# Patient Record
Sex: Female | Born: 1970
Health system: Southern US, Community
[De-identification: ages and names within clinical notes are randomized; demographics above are authoritative.]

## PROBLEM LIST (undated history)

## (undated) DIAGNOSIS — E039 Hypothyroidism, unspecified: Secondary | ICD-10-CM

## (undated) DIAGNOSIS — N390 Urinary tract infection, site not specified: Secondary | ICD-10-CM

## (undated) DIAGNOSIS — R561 Post traumatic seizures: Secondary | ICD-10-CM

## (undated) DIAGNOSIS — M659 Synovitis and tenosynovitis, unspecified: Secondary | ICD-10-CM

## (undated) DIAGNOSIS — G43909 Migraine, unspecified, not intractable, without status migrainosus: Secondary | ICD-10-CM

## (undated) DIAGNOSIS — G8929 Other chronic pain: Secondary | ICD-10-CM

## (undated) DIAGNOSIS — T7840XA Allergy, unspecified, initial encounter: Secondary | ICD-10-CM

## (undated) DIAGNOSIS — E282 Polycystic ovarian syndrome: Secondary | ICD-10-CM

## (undated) DIAGNOSIS — M1990 Unspecified osteoarthritis, unspecified site: Secondary | ICD-10-CM

## (undated) DIAGNOSIS — R51 Headache: Secondary | ICD-10-CM

## (undated) DIAGNOSIS — I10 Essential (primary) hypertension: Secondary | ICD-10-CM

## (undated) DIAGNOSIS — E669 Obesity, unspecified: Secondary | ICD-10-CM

## (undated) DIAGNOSIS — J45909 Unspecified asthma, uncomplicated: Secondary | ICD-10-CM

## (undated) DIAGNOSIS — E063 Autoimmune thyroiditis: Secondary | ICD-10-CM

## (undated) DIAGNOSIS — Z90710 Acquired absence of both cervix and uterus: Secondary | ICD-10-CM

## (undated) DIAGNOSIS — M25561 Pain in right knee: Secondary | ICD-10-CM

## (undated) DIAGNOSIS — J329 Chronic sinusitis, unspecified: Secondary | ICD-10-CM

## (undated) HISTORY — DX: Obesity, unspecified: E66.9

## (undated) HISTORY — DX: Chronic sinusitis, unspecified: J32.9

## (undated) HISTORY — DX: Migraine, unspecified, not intractable, without status migrainosus: G43.909

## (undated) HISTORY — PX: KNEE SURGERY: SHX244

## (undated) HISTORY — DX: Unspecified osteoarthritis, unspecified site: M19.90

## (undated) HISTORY — DX: Autoimmune thyroiditis: E06.3

## (undated) HISTORY — DX: Acquired absence of both cervix and uterus: Z90.710

## (undated) HISTORY — DX: Allergy, unspecified, initial encounter: T78.40XA

## (undated) HISTORY — DX: Urinary tract infection, site not specified: N39.0

## (undated) HISTORY — DX: Polycystic ovarian syndrome: E28.2

## (undated) HISTORY — DX: Essential (primary) hypertension: I10

## (undated) HISTORY — DX: Post traumatic seizures: R56.1

---

## 1995-12-29 HISTORY — PX: OTHER SURGICAL HISTORY: SHX169

## 1999-03-28 ENCOUNTER — Ambulatory Visit (HOSPITAL_COMMUNITY): Admission: RE | Admit: 1999-03-28 | Discharge: 1999-03-28 | Payer: Self-pay | Admitting: Family Medicine

## 1999-03-28 ENCOUNTER — Encounter: Payer: Self-pay | Admitting: Family Medicine

## 1999-12-01 ENCOUNTER — Encounter: Admission: RE | Admit: 1999-12-01 | Discharge: 1999-12-18 | Payer: Self-pay | Admitting: Family Medicine

## 2000-07-20 ENCOUNTER — Other Ambulatory Visit: Admission: RE | Admit: 2000-07-20 | Discharge: 2000-07-20 | Payer: Self-pay | Admitting: Obstetrics and Gynecology

## 2001-09-01 ENCOUNTER — Inpatient Hospital Stay (HOSPITAL_COMMUNITY): Admission: AD | Admit: 2001-09-01 | Discharge: 2001-09-02 | Payer: Self-pay | Admitting: Family Medicine

## 2001-09-02 ENCOUNTER — Encounter: Payer: Self-pay | Admitting: Family Medicine

## 2001-09-28 ENCOUNTER — Other Ambulatory Visit: Admission: RE | Admit: 2001-09-28 | Discharge: 2001-09-28 | Payer: Self-pay | Admitting: Obstetrics and Gynecology

## 2002-03-09 ENCOUNTER — Encounter: Payer: Self-pay | Admitting: Emergency Medicine

## 2002-03-09 ENCOUNTER — Emergency Department (HOSPITAL_COMMUNITY): Admission: EM | Admit: 2002-03-09 | Discharge: 2002-03-09 | Payer: Self-pay | Admitting: Emergency Medicine

## 2003-01-09 ENCOUNTER — Emergency Department (HOSPITAL_COMMUNITY): Admission: EM | Admit: 2003-01-09 | Discharge: 2003-01-10 | Payer: Self-pay | Admitting: Emergency Medicine

## 2005-02-16 ENCOUNTER — Ambulatory Visit: Payer: Self-pay | Admitting: Family Medicine

## 2005-02-22 ENCOUNTER — Encounter: Admission: RE | Admit: 2005-02-22 | Discharge: 2005-02-22 | Payer: Self-pay | Admitting: Family Medicine

## 2005-11-02 ENCOUNTER — Ambulatory Visit: Payer: Self-pay | Admitting: Family Medicine

## 2005-11-05 ENCOUNTER — Ambulatory Visit: Payer: Self-pay | Admitting: Family Medicine

## 2006-02-04 ENCOUNTER — Other Ambulatory Visit: Admission: RE | Admit: 2006-02-04 | Discharge: 2006-02-04 | Payer: Self-pay | Admitting: Obstetrics and Gynecology

## 2006-03-18 ENCOUNTER — Ambulatory Visit: Payer: Self-pay | Admitting: Family Medicine

## 2006-05-12 ENCOUNTER — Ambulatory Visit: Payer: Self-pay | Admitting: Family Medicine

## 2006-05-17 ENCOUNTER — Ambulatory Visit: Payer: Self-pay | Admitting: Family Medicine

## 2006-05-20 ENCOUNTER — Ambulatory Visit: Payer: Self-pay | Admitting: Cardiology

## 2006-07-09 ENCOUNTER — Ambulatory Visit: Payer: Self-pay | Admitting: Internal Medicine

## 2006-09-16 ENCOUNTER — Ambulatory Visit: Payer: Self-pay | Admitting: Family Medicine

## 2007-04-13 DIAGNOSIS — J309 Allergic rhinitis, unspecified: Secondary | ICD-10-CM

## 2007-04-13 DIAGNOSIS — J45909 Unspecified asthma, uncomplicated: Secondary | ICD-10-CM | POA: Insufficient documentation

## 2007-04-13 DIAGNOSIS — Z87898 Personal history of other specified conditions: Secondary | ICD-10-CM

## 2007-04-13 DIAGNOSIS — F17200 Nicotine dependence, unspecified, uncomplicated: Secondary | ICD-10-CM

## 2007-04-13 DIAGNOSIS — G43909 Migraine, unspecified, not intractable, without status migrainosus: Secondary | ICD-10-CM | POA: Insufficient documentation

## 2007-04-17 DIAGNOSIS — E063 Autoimmune thyroiditis: Secondary | ICD-10-CM

## 2008-08-29 ENCOUNTER — Ambulatory Visit (HOSPITAL_COMMUNITY): Admission: RE | Admit: 2008-08-29 | Discharge: 2008-08-29 | Payer: Self-pay | Admitting: Obstetrics and Gynecology

## 2008-12-28 DIAGNOSIS — E282 Polycystic ovarian syndrome: Secondary | ICD-10-CM

## 2008-12-28 HISTORY — DX: Polycystic ovarian syndrome: E28.2

## 2009-07-11 ENCOUNTER — Emergency Department (HOSPITAL_COMMUNITY): Admission: EM | Admit: 2009-07-11 | Discharge: 2009-07-11 | Payer: Self-pay | Admitting: Emergency Medicine

## 2011-02-04 ENCOUNTER — Other Ambulatory Visit: Payer: Self-pay | Admitting: Family Medicine

## 2011-02-04 DIAGNOSIS — R42 Dizziness and giddiness: Secondary | ICD-10-CM

## 2011-02-04 DIAGNOSIS — G43909 Migraine, unspecified, not intractable, without status migrainosus: Secondary | ICD-10-CM

## 2011-02-10 ENCOUNTER — Ambulatory Visit
Admission: RE | Admit: 2011-02-10 | Discharge: 2011-02-10 | Disposition: A | Discharge status: Home / Self Care | Payer: PRIVATE HEALTH INSURANCE | Source: Ambulatory Visit | Attending: Family Medicine | Admitting: Family Medicine

## 2011-02-10 DIAGNOSIS — G43909 Migraine, unspecified, not intractable, without status migrainosus: Secondary | ICD-10-CM

## 2011-02-10 DIAGNOSIS — R42 Dizziness and giddiness: Secondary | ICD-10-CM

## 2011-04-06 LAB — COMPREHENSIVE METABOLIC PANEL
ALT: 37 U/L — ABNORMAL HIGH (ref 0–35)
AST: 36 U/L (ref 0–37)
CO2: 18 mEq/L — ABNORMAL LOW (ref 19–32)
Chloride: 106 mEq/L (ref 96–112)
GFR calc Af Amer: >60 mL/min (ref >60)
GFR calc non Af Amer: >60 mL/min (ref >60)
Potassium: 3.2 mEq/L — ABNORMAL LOW (ref 3.5–5.1)
Sodium: 136 mEq/L (ref 135–145)
Total Bilirubin: 0.9 mg/dL (ref 0.3–1.2)

## 2011-04-06 LAB — SALICYLATE LEVEL: Salicylate Lvl: <4 mg/dL (ref 2.8–20.0)

## 2011-04-06 LAB — DIFFERENTIAL
Basophils Absolute: 0 10*3/uL (ref 0.0–0.1)
Eosinophils Absolute: 0 10*3/uL (ref 0.0–0.7)
Eosinophils Relative: 1 % (ref 0–5)

## 2011-04-06 LAB — CBC
RBC: 4.65 MIL/uL (ref 3.87–5.11)
WBC: 6.9 10*3/uL (ref 4.0–10.5)

## 2011-04-06 LAB — ACETAMINOPHEN LEVEL: Acetaminophen (Tylenol), Serum: 49.7 ug/mL — ABNORMAL HIGH (ref 10–30)

## 2011-04-06 LAB — RAPID URINE DRUG SCREEN, HOSP PERFORMED
Barbiturates: POSITIVE — AB
Cocaine: NOT DETECTED
Opiates: NOT DETECTED

## 2011-04-06 LAB — PREGNANCY, URINE: Preg Test, Ur: NEGATIVE

## 2011-05-15 NOTE — Discharge Summary (Signed)
[place]. [facility]  Patient:    KENSIE, SUSMAN Visit Number: [id number] MRN: [id number]          Service Type: MED Location: [id number] Attending Physician:  Angelena Sole. Dictated by:   Cornell Barman, P.A. Admit Date:  09/01/2001 Discharge Date: 09/02/2001   CC:         Angelena Sole, M.D. [facility]   Discharge Summary  DISCHARGE DIAGNOSES: 1. Fever. 2. Nausea, vomiting. 3. Urinary tract infection.  BRIEF HISTORY:  Ms. Sharyn Creamer is a 40 year old white female who presented to Dr. Cleora Fleet office on the day of admission with intermittent dull lower back pain for the past week.  Prior to admission, she developed urinary frequency, dysuria, and fever.  The patient was seen in the office and found to have a urinary tract infection.  She was given Cipro and sent home.  Later in the day, she called stating that she had two episodes of emesis and persistent fever and chills.  The patient was admitted for further evaluation to rule out pyelonephritis.  PAST MEDICAL HISTORY:  Hashimoto.  Migraine headache.  Bilateral knee arthroscopy.  HOSPITAL COURSE: #1 - INFECTIOUS DISEASE:  On admission to the hospital, the patients temperature was 102.6.  White count was 10.4 with 83% neutrophils.  The patient had been started on Cipro but was admitted with IV Tequin.  A renal ultrasound was ordered to rule out obstruction of pyelonephritis.  A renal ultrasound was negative for any obstruction or pyelonephritis.  The patient defervesced and her nausea and vomiting resolved.  The patient was symptomatically improved and was felt to be stable for discharge.  #2 - HYPOKALEMIA:  Potassium was 3.1 probably secondary to her emesis.  We will give her replacement potassium prior to discharge.  #3 - HYPERGLYCEMIA:  The patients blood sugar was 162.  We did not further evaluate this and we will defer that to her primary physician.  LABORATORY DATA:  Potassium of  3.1, serum glucose 162.  Hemoglobin 12.1, white count 10.4 with 83% neutrophils.  Pending are blood cultures and renal ultrasound.  Preliminary report was negative for abscess, hydronephrosis, or obstructed ureter.  DISCHARGE MEDICATIONS: 1. Synthroid 75 mcg q.d. 2. Cytomel 25 mg q.d. 3. Dextra 20 mg as at home. 4. She is instructed to resume Cipro 500 mg b.i.d. to complete a course    of antibiotics.  FOLLOW-UP:  The patient is to follow up with Dr. Angelena Sole a couple of days after she completes the Cipro for a repeat urinalysis. Dictated by:   Cornell Barman, P.A. Attending Physician:  Angelena Sole. DD:  09/02/01 TD:  09/03/01 Job: [id number]

## 2012-02-13 ENCOUNTER — Ambulatory Visit (INDEPENDENT_AMBULATORY_CARE_PROVIDER_SITE_OTHER): Payer: BC Managed Care – PPO | Admitting: Emergency Medicine

## 2012-02-13 ENCOUNTER — Ambulatory Visit: Payer: BC Managed Care – PPO

## 2012-02-13 VITALS — BP 137/100 | HR 66 | Temp 98.3°F | Resp 20 | Ht 66.0 in | Wt 197.8 lb

## 2012-02-13 DIAGNOSIS — R05 Cough: Secondary | ICD-10-CM

## 2012-02-13 DIAGNOSIS — J019 Acute sinusitis, unspecified: Secondary | ICD-10-CM

## 2012-02-13 DIAGNOSIS — J40 Bronchitis, not specified as acute or chronic: Secondary | ICD-10-CM

## 2012-02-13 LAB — POCT CBC
Granulocyte percent: 54.2 %G (ref 37–80)
HCT, POC: 39.7 % (ref 37.7–47.9)
MCV: 91.3 fL (ref 80–97)
MID (cbc): 0.4 (ref 0–0.9)
POC Granulocyte: 3.2 (ref 2–6.9)
Platelet Count, POC: 346 10*3/uL (ref 142–424)
RBC: 4.35 M/uL (ref 4.04–5.48)

## 2012-02-13 MED ORDER — CEFDINIR 300 MG PO CAPS: 300.0000 mg | ORAL_CAPSULE | Freq: Two times a day (BID) | ORAL | Status: AC

## 2012-02-13 MED ORDER — HYDROCODONE-HOMATROPINE 5-1.5 MG/5ML PO SYRP: ORAL_SOLUTION | ORAL | Status: AC

## 2012-02-13 MED ORDER — IPRATROPIUM BROMIDE 0.06 % NA SOLN: 2.0000 | Freq: Two times a day (BID) | NASAL | Status: DC

## 2012-02-13 NOTE — Progress Notes (Signed)
Patient ID: CONSTANTINA LASETER MRN: [id number], DOB: 01/27/1971, 41 y.o. Date of Encounter: 02/13/2012, 11:40 AM  Primary Physician: No primary provider on file.  Chief Complaint:  Chief Complaint  Patient presents with  . Fever    x 2 weeks  . Nasal Congestion    face hurt  . Cough    sob; wheezing    HPI: 41 y.o. year old female presents with 2 week history of worsening nasal congestion, cough, sinus pressure, and subjective tactile fever. Seen at outside urgent care about 10 days prior. Given Prednisone 5 day taper (finished 02/08/12), and Z-pack (finished 02/08/12). Initially symptoms improved with this treatment, they however returned and are now worse. Has developed some chest congestion. Cough is productive of green sputum and not associated with time of day. Sinus headache. T max 98.8. Hearing is muffled. Nasal congestion thick and green. Pushing fluids. No GI symptoms/no reflux. Friend's child with URI/fever the day before symptoms began.   No leg trauma, sedentary periods, h/o cancer, or tobacco use.  Past Medical History  Diagnosis Date  . PCOS (polycystic ovarian syndrome) 2010    followed by Dr. Perlie Gold  . Hashimoto's thyroiditis      Home Meds: Prior to Admission medications   Medication Sig Start Date End Date Taking? Authorizing Provider  levothyroxine (SYNTHROID, LEVOTHROID) 50 MCG tablet Take 50 mcg by mouth daily.   Yes Historical Provider, MD  liothyronine (CYTOMEL) 25 MCG tablet Take 25 mcg by mouth daily.   Yes Historical Provider, MD  metFORMIN (GLUCOPHAGE) 500 MG tablet Take 500 mg by mouth 3 (three) times daily.   Yes Historical Provider, MD  propranolol (INDERAL LA) 120 MG 24 hr capsule Take 120 mg by mouth daily.   Yes Historical Provider, MD  temazepam (RESTORIL) 30 MG capsule Take 30 mg by mouth at bedtime as needed.   Yes Historical Provider, MD  traMADol (ULTRAM) 50 MG tablet Take 50 mg by mouth every 6 (six) hours as needed.   Yes Historical  Provider, MD    Allergies: No Known Allergies  History   Social History  . Marital Status: Single    Spouse Name: N/A    Number of Children: N/A  . Years of Education: N/A   Occupational History  . Not on file.   Social History Main Topics  . Smoking status: Never Smoker   . Smokeless tobacco: Not on file  . Alcohol Use: Not on file  . Drug Use: Not on file  . Sexually Active: Not on file   Other Topics Concern  . Not on file   Social History Narrative  . No narrative on file     Review of Systems: Constitutional: negative for chills, fever, night sweats or weight changes Cardiovascular: negative for chest pain or palpitations Respiratory: negative for hemoptysis, wheezing, or shortness of breath Abdominal: negative for abdominal pain, nausea, vomiting or diarrhea Dermatological: negative for rash Neurologic: negative for headache   Physical Exam: Blood pressure 137/100, pulse 66, temperature 98.3 F (36.8 C), temperature source Oral, resp. rate 20, height 5\' 6"  (1.676 m), weight 197 lb 12.8 oz (89.721 kg), last menstrual period 02/08/2012., Body mass index is 31.93 kg/(m^2). General: Well developed, well nourished, in no acute distress. Head: Normocephalic, atraumatic, eyes without discharge, sclera non-icteric, nares are congested. Bilateral auditory canals clear, TM's are without perforation, pearly grey with reflective cone of light bilaterally. Bilateral maxillary sinuses TTP. Oral cavity moist, dentition normal. Posterior pharynx with post nasal  drip and mild erythema. No peritonsillar abscess or tonsillar exudate. Neck: Supple. No thyromegaly. Full ROM. No lymphadenopathy. Lungs: Coarse breath sounds without wheezes, rales, or rhonchi. Breathing is unlabored. Heart: RRR with S1 S2. No murmurs, rubs, or gallops appreciated. Msk:  Strength and tone normal for age. Extremities: No clubbing or cyanosis. No edema. Neuro: Alert and oriented X 3. Moves all extremities  spontaneously. CNII-XII grossly in tact. Psych:  Responds to questions appropriately with a normal affect.   Labs: Results for orders placed in visit on 02/13/12  POCT CBC      Component Value Range   WBC 5.9  4.6 - 10.2 (K/uL)   Lymph, poc 2.3  0.6 - 3.4    POC LYMPH PERCENT 38.4  10 - 50 (%L)   MID (cbc) 0.4  0 - 0.9    POC MID % 7.4  0 - 12 (%M)   POC Granulocyte 3.2  2 - 6.9    Granulocyte percent 54.2  37 - 80 (%G)   RBC 4.35  4.04 - 5.48 (M/uL)   Hemoglobin 12.6  12.2 - 16.2 (g/dL)   HCT, POC 54.0  98.1 - 47.9 (%)   MCV 91.3  80 - 97 (fL)   MCH, POC 29.0  27 - 31.2 (pg)   MCHC 31.7 (*) 31.8 - 35.4 (g/dL)   RDW, POC 19.1     Platelet Count, POC 346  142 - 424 (K/uL)   MPV 8.6  0 - 99.8 (fL)   [facility] reading (PRIMARY) by  Dr. Cleta Alberts. NAD    ASSESSMENT AND PLAN:  41 y.o. year old female with sinobronchitis. -Omnicef 300 mg 1 po bid #20 no RF  -Hycodan #4oz 1 tsp po q 4-6 hours prn cough no RF SED -Atrovent NS 0.06% 2 sprays each nare bid prn #1 no RF -Mucinex -Tylenol/Motrin prn -Rest/fluids -RTC precautions -RTC 3-5 days if no improvement  Signed, Eula Listen, PA-C 02/13/2012 11:40 AM

## 2012-02-13 NOTE — Patient Instructions (Signed)

## 2012-09-05 ENCOUNTER — Ambulatory Visit (INDEPENDENT_AMBULATORY_CARE_PROVIDER_SITE_OTHER): Payer: BC Managed Care – PPO | Admitting: Family Medicine

## 2012-09-05 VITALS — BP 126/88 | HR 68 | Temp 98.1°F | Resp 14 | Ht 66.5 in | Wt 199.0 lb

## 2012-09-05 DIAGNOSIS — R51 Headache: Secondary | ICD-10-CM

## 2012-09-05 DIAGNOSIS — R5383 Other fatigue: Secondary | ICD-10-CM

## 2012-09-05 DIAGNOSIS — R5381 Other malaise: Secondary | ICD-10-CM

## 2012-09-05 DIAGNOSIS — T148XXA Other injury of unspecified body region, initial encounter: Secondary | ICD-10-CM

## 2012-09-05 DIAGNOSIS — K219 Gastro-esophageal reflux disease without esophagitis: Secondary | ICD-10-CM

## 2012-09-05 DIAGNOSIS — R079 Chest pain, unspecified: Secondary | ICD-10-CM

## 2012-09-05 LAB — COMPREHENSIVE METABOLIC PANEL
Alkaline Phosphatase: 49 U/L (ref 39–117)
BUN: 11 mg/dL (ref 6–23)
CO2: 23 mEq/L (ref 19–32)
Glucose, Bld: 85 mg/dL (ref 70–99)
Total Bilirubin: 0.3 mg/dL (ref 0.3–1.2)

## 2012-09-05 LAB — POCT CBC
HCT, POC: 44.8 % (ref 37.7–47.9)
Lymph, poc: 2.2 (ref 0.6–3.4)
MCH, POC: 29.4 pg (ref 27–31.2)
MCHC: 31.3 g/dL — AB (ref 31.8–35.4)
MCV: 94.2 fL (ref 80–97)
POC Granulocyte: 4.1 (ref 2–6.9)
POC LYMPH PERCENT: 32.6 %L (ref 10–50)
RDW, POC: 12.7 %
WBC: 6.7 10*3/uL (ref 4.6–10.2)

## 2012-09-05 LAB — POCT UA - MICROSCOPIC ONLY
Casts, Ur, LPF, POC: NEGATIVE
Crystals, Ur, HPF, POC: NEGATIVE
Yeast, UA: NEGATIVE

## 2012-09-05 LAB — POCT GLYCOSYLATED HEMOGLOBIN (HGB A1C): Hemoglobin A1C: 5

## 2012-09-05 LAB — POCT URINALYSIS DIPSTICK
Blood, UA: NEGATIVE
Nitrite, UA: NEGATIVE
Protein, UA: NEGATIVE
Spec Grav, UA: 1.025
Urobilinogen, UA: 0.2
pH, UA: 6.5

## 2012-09-05 LAB — TSH: TSH: 2.031 u[IU]/mL (ref 0.350–4.500)

## 2012-09-05 MED ORDER — GI COCKTAIL ~~LOC~~
30.0000 mL | Freq: Once | ORAL | Status: AC
  Administered 2012-09-05: 30 mL via ORAL

## 2012-09-05 NOTE — Progress Notes (Addendum)
[facility] [address] [place] [address] [phone]  Date:  09/05/2012   Name:  Daisy Ramirez   DOB:  12-Feb-1971   MRN:  [id number]  PCP:  No primary provider on file.    Chief Complaint: Fatigue, Headache and Gastrophageal Reflux   History of Present Illness:  Daisy Ramirez is a 41 y.o. very pleasant female patient who presents with the following:  Here today to evaluate a constellation of symptoms, all of which have been present for about 4 months.  She has noted fatigue which is getting worse.  She thought at first that she was sick, but her fatigue has not relented.   She also notes HA, earache, ringing in her ears, pain in her armpits, pain in her lower back, and a bruise on her left leg that has been present for 2 months without healing.   She has been following her temperature, but has not noted a fever greater than 99.6 or so.  However, she feels that her normal temperature is 96 degrees so she wonders if 99.6 does constitute a fever for her.    She is not losing weight, fluctuates a couple of lbs up and down.  She has tried to lose weight but has not been successful. Feels that her neck is swollen.  Diagnosed with Hasimoto's at 41 years old.  She has not had a TSH check in some time.    She also has a history of PCOS. She uses metformin for this per Dr. Vincente Poli.  She has some light menstrual bleeding right now. She took a home HCG last week and it was negative.  Is also on OCP She has noted some diarrhea.  No vomiting.    She has noted GERD symptoms. She has noted chest pains- dull aches and sharp pains.  This has gone on for several months and is not related to exertion.  She has a little bit of an ache now- thinks this is all due to GERD.  Feels like GERD that she has noted in the past.      There is a familiy history of HTN and high choelsterol.   She usually does best with TSH between 0.5 and 1  Patient Active Problem List  Diagnosis   . HASHIMOTO'S THYROIDITIS  . SMOKER  . ALLERGIC RHINITIS  . ASTHMA  . MIGRAINES, HX OF    Past Medical History  Diagnosis Date  . PCOS (polycystic ovarian syndrome) 2010    followed by Dr. Perlie Gold  . Hashimoto's thyroiditis     Past Surgical History  Procedure Date  . Bilateral knee scope 1997    History  Substance Use Topics  . Smoking status: Never Smoker   . Smokeless tobacco: Not on file  . Alcohol Use: Not on file    No family history on file.  No Known Allergies  Medication list has been reviewed and updated.  Current Outpatient Prescriptions on File Prior to Visit  Medication Sig Dispense Refill  . fluticasone (FLONASE) 50 MCG/ACT nasal spray Place 2 sprays into the nose daily.      Marland Kitchen levothyroxine (SYNTHROID, LEVOTHROID) 50 MCG tablet Take 50 mcg by mouth daily.      Marland Kitchen liothyronine (CYTOMEL) 25 MCG tablet Take 25 mcg by mouth daily.      . metFORMIN (GLUCOPHAGE) 500 MG tablet Take 500 mg by mouth 3 (three) times daily.      . norethindrone-ethinyl estradiol (MICROGESTIN,JUNEL,LOESTRIN) 1-20  MG-MCG tablet Take 1 tablet by mouth daily.      . propranolol (INDERAL LA) 120 MG 24 hr capsule Take 120 mg by mouth daily.      . temazepam (RESTORIL) 30 MG capsule Take 30 mg by mouth at bedtime as needed.      . traMADol (ULTRAM) 50 MG tablet Take 50 mg by mouth every 6 (six) hours as needed.      Marland Kitchen ipratropium (ATROVENT) 0.06 % nasal spray Place 2 sprays into the nose 2 (two) times daily.  15 mL  0    Review of Systems:  As per HPI- otherwise negative.   Physical Examination: Filed Vitals:   09/05/12 1017  BP: 126/88  Pulse: 68  Temp: 98.1 F (36.7 C)  Resp: 14   Filed Vitals:   09/05/12 1017  Height: 5' 6.5" (1.689 m)  Weight: 199 lb (90.266 kg)   Body mass index is 31.64 kg/(m^2). Ideal Body Weight: Weight in (lb) to have BMI = 25: 156.9   GEN: WDWN, NAD, Non-toxic, A & O x 3, obese HEENT: Atraumatic, Normocephalic. Neck supple. No masses, No  LAD.  TM and oropharynx wnl, PEERL, EOMI.  Ears and Nose: No external deformity. CV: RRR, No M/G/R. No JVD. No thrill. No extra heart sounds. PULM: CTA B, no wheezes, crackles, rhonchi. No retractions. No resp. distress. No accessory muscle use. ABD: S, NT, ND, +BS. No rebound. No HSM. EXTR: No c/c/e.  Not able to appreciate "bruise" on left shin, but she does have a tender area where she took a blow to the shin a couple of months ago.   No axillary lymph nodes noted. No swelling or tenderness.   NEURO Normal gait.  PSYCH: Normally interactive. Conversant. Not depressed or anxious appearing.  Calm demeanor.    EKG: NSR, no ST elevation or depression.  GI cocktail: did not make a difference in her symptoms  Results for orders placed in visit on 09/05/12  POCT CBC      Component Value Range   WBC 6.7  4.6 - 10.2 K/uL   Lymph, poc 2.2  0.6 - 3.4   POC LYMPH PERCENT 32.6  10 - 50 %L   MID (cbc) 0.4  0 - 0.9   POC MID % 5.5  0 - 12 %M   POC Granulocyte 4.1  2 - 6.9   Granulocyte percent 61.9  37 - 80 %G   RBC 4.76  4.04 - 5.48 M/uL   Hemoglobin 14.0  12.2 - 16.2 g/dL   HCT, POC 30.8  65.7 - 47.9 %   MCV 94.2  80 - 97 fL   MCH, POC 29.4  27 - 31.2 pg   MCHC 31.3 (*) 31.8 - 35.4 g/dL   RDW, POC 84.6     Platelet Count, POC 408  142 - 424 K/uL   MPV 9.0  0 - 99.8 fL  POCT GLYCOSYLATED HEMOGLOBIN (HGB A1C)      Component Value Range   Hemoglobin A1C 5.0    POCT UA - MICROSCOPIC ONLY      Component Value Range   WBC, Ur, HPF, POC 0-1     RBC, urine, microscopic 0-1     Bacteria, U Microscopic trace     Mucus, UA trace     Epithelial cells, urine per micros 1-5     Crystals, Ur, HPF, POC neg     Casts, Ur, LPF, POC neg     Yeast, UA neg  POCT URINALYSIS DIPSTICK      Component Value Range   Color, UA yellow     Clarity, UA clear     Glucose, UA neg     Bilirubin, UA neg     Ketones, UA trace     Spec Grav, UA 1.025     Blood, UA neg     pH, UA 6.5     Protein, UA neg       Urobilinogen, UA 0.2     Nitrite, UA neg     Leukocytes, UA Negative    POCT URINE PREGNANCY      Component Value Range   Preg Test, Ur Negative      Assessment and Plan: 1. Fatigue  POCT CBC, POCT glycosylated hemoglobin (Hb A1C), POCT UA - Microscopic Only, POCT urinalysis dipstick, POCT urine pregnancy, Comprehensive metabolic panel, TSH, EKG 12-Lead  2. Chest pain    3. GERD (gastroesophageal reflux disease)  EKG 12-Lead, gi cocktail (Maalox,Lidocaine,Donnatal)  4. Headache    5. Malaise    6. Bruising  POCT CBC   Multiple symptoms today.  Most likely problem is that her thyroid replacement may need adjustment.  Await her TSH and also CMP.  Other POC labs done today as above and are normal.  If her TSH is normal consider having her see cards for a stress test.  However, her duration of symptoms/ description of symptoms and normal EKG are certainly reassuring today.  Will plan further follow- up pending labs- Sooner if worse or if there is any change.      Omaree Fuqua, MD  Called on 9/12- received labs, look ok but her TSH is just above 2.   Daisy Ramirez has done best when her TSH is more around 0.5 to 1 in the past.  We can increase her levothyroxine a little bit to 75 mcg, and she will come in for a recheck TSH in 6- 8 weeks.  She will let me know sooner if this is not helping.

## 2012-09-08 MED ORDER — LEVOTHYROXINE SODIUM 75 MCG PO TABS: 75.0000 ug | ORAL_TABLET | Freq: Every day | ORAL | Status: DC

## 2012-09-08 NOTE — Addendum Note (Signed)
Addended by: Abbe Amsterdam C on: 09/08/2012 05:17 PM   Modules accepted: Orders

## 2012-11-04 ENCOUNTER — Encounter (HOSPITAL_COMMUNITY): Payer: Self-pay | Admitting: Pharmacist

## 2012-11-11 ENCOUNTER — Encounter (HOSPITAL_COMMUNITY): Payer: Self-pay

## 2012-11-11 ENCOUNTER — Encounter (HOSPITAL_COMMUNITY)
Admission: RE | Admit: 2012-11-11 | Discharge: 2012-11-11 | Disposition: A | Discharge status: Home / Self Care | Payer: BC Managed Care – PPO | Source: Ambulatory Visit | Attending: Obstetrics and Gynecology | Admitting: Obstetrics and Gynecology

## 2012-11-11 HISTORY — DX: Hypothyroidism, unspecified: E03.9

## 2012-11-11 HISTORY — DX: Headache: R51

## 2012-11-11 HISTORY — DX: Unspecified asthma, uncomplicated: J45.909

## 2012-11-11 LAB — SURGICAL PCR SCREEN
MRSA, PCR: NEGATIVE
Staphylococcus aureus: NEGATIVE

## 2012-11-11 LAB — CBC
Hemoglobin: 12.9 g/dL (ref 12.0–15.0)
MCH: 30.2 pg (ref 26.0–34.0)
MCHC: 34.1 g/dL (ref 30.0–36.0)
Platelets: 318 10*3/uL (ref 150–400)
RBC: 4.27 MIL/uL (ref 3.87–5.11)

## 2012-11-11 LAB — BASIC METABOLIC PANEL
CO2: 21 mEq/L (ref 19–32)
Calcium: 9.5 mg/dL (ref 8.4–10.5)
GFR calc non Af Amer: >90 mL/min (ref >90)
Glucose, Bld: 90 mg/dL (ref 70–99)
Potassium: 4.3 mEq/L (ref 3.5–5.1)
Sodium: 133 mEq/L — ABNORMAL LOW (ref 135–145)

## 2012-11-11 NOTE — Patient Instructions (Addendum)
Your procedure is scheduled on:11/16/12  Enter through the Main Entrance at : 0700 am Pick up desk phone and dial [phone] and inform us of your arrival.  Please call [phone] if you have any problems the morning of surgery.  Remember: Do not eat or drink after midnight:Tuesday   Take these meds the morning of surgery with a sip of water: thyroid meds  DO NOT wear jewelry, eye make-up, lipstick,body lotion, or dark fingernail polish. Do not shave for 48 hours prior to surgery.  If you are to be admitted after surgery, leave suitcase in car until your room has been assigned.

## 2012-11-15 MED ORDER — CEFAZOLIN SODIUM-DEXTROSE 2-3 GM-% IV SOLR
2.0000 g | INTRAVENOUS | Status: AC
  Administered 2012-11-16: 2 g via INTRAVENOUS

## 2012-11-15 NOTE — H&P (Signed)
41 year old G 1 P 0 presents for TAH and BSO. She has a long history of PCOS, Pelvic pain, Ovarian cysts and DUB. She underwent a D and C in the pas. Sometimes she bleeds for 8 weeks at a time. She has tried multiple birth control pills without any relief.  She also has a lot of lower abdominal pelvic pain that seems to bother her all the time.   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    Medical history : Headaches Hypothyroidism PCOS  Surgical History: D AND C  Meds: Synthroid Cytomel Metformin Tramadol Birth Control pills  NKDA  Family history: Unremarkable  Social History: No tobacco Single 2 alcohol drinks per day  ROS: Positive for fatigue and headaches  Afebrile Vital signs stable General alert and oriented Lung CTAB Car RRR Abdomen is soft and non tender Pelvic external genitalia within normal limits. Vagina appears normal Cervix is deep in the vagina Uterus is mobile and very tender with movement. Right adnexal tenderness is noted Left adnexal tenderness is noted but less than on the right  IMPRESSION: Abnormal bleeding and pelvic pain  PLAN: TAH AND BSO Risks of infection, injury to organs, bleeding, anesthesia reviewed with the patient

## 2012-11-16 ENCOUNTER — Inpatient Hospital Stay (HOSPITAL_COMMUNITY): Payer: BC Managed Care – PPO | Admitting: Registered Nurse

## 2012-11-16 ENCOUNTER — Inpatient Hospital Stay (HOSPITAL_COMMUNITY)
Admission: RE | Admit: 2012-11-16 | Discharge: 2012-11-18 | DRG: 359 | Disposition: A | Discharge status: Home / Self Care | Payer: BC Managed Care – PPO | Source: Ambulatory Visit | Attending: Obstetrics and Gynecology | Admitting: Obstetrics and Gynecology

## 2012-11-16 ENCOUNTER — Encounter (HOSPITAL_COMMUNITY): Payer: Self-pay | Admitting: Registered Nurse

## 2012-11-16 ENCOUNTER — Encounter (HOSPITAL_COMMUNITY)
Admission: RE | Disposition: A | Discharge status: Home / Self Care | Payer: Self-pay | Source: Ambulatory Visit | Attending: Obstetrics and Gynecology

## 2012-11-16 ENCOUNTER — Encounter (HOSPITAL_COMMUNITY): Payer: Self-pay

## 2012-11-16 DIAGNOSIS — N938 Other specified abnormal uterine and vaginal bleeding: Secondary | ICD-10-CM | POA: Diagnosis present

## 2012-11-16 DIAGNOSIS — E282 Polycystic ovarian syndrome: Secondary | ICD-10-CM | POA: Diagnosis present

## 2012-11-16 DIAGNOSIS — D251 Intramural leiomyoma of uterus: Secondary | ICD-10-CM | POA: Diagnosis present

## 2012-11-16 DIAGNOSIS — D250 Submucous leiomyoma of uterus: Secondary | ICD-10-CM | POA: Diagnosis present

## 2012-11-16 DIAGNOSIS — N949 Unspecified condition associated with female genital organs and menstrual cycle: Principal | ICD-10-CM | POA: Diagnosis present

## 2012-11-16 DIAGNOSIS — N838 Other noninflammatory disorders of ovary, fallopian tube and broad ligament: Secondary | ICD-10-CM | POA: Diagnosis present

## 2012-11-16 DIAGNOSIS — R5383 Other fatigue: Secondary | ICD-10-CM

## 2012-11-16 HISTORY — PX: ABDOMINAL HYSTERECTOMY: SHX81

## 2012-11-16 HISTORY — PX: SALPINGOOPHORECTOMY: SHX82

## 2012-11-16 LAB — PREGNANCY, URINE: Preg Test, Ur: NEGATIVE

## 2012-11-16 SURGERY — HYSTERECTOMY, ABDOMINAL
Anesthesia: General | Site: Abdomen | Wound class: Clean Contaminated

## 2012-11-16 MED ORDER — MEPERIDINE HCL 25 MG/ML IJ SOLN
6.2500 mg | INTRAMUSCULAR | Status: DC | PRN
  Administered 2012-11-16: 12.5 mg via INTRAVENOUS

## 2012-11-16 MED ORDER — FLUTICASONE PROPIONATE 50 MCG/ACT NA SUSP
2.0000 | Freq: Every day | NASAL | Status: DC | PRN
  Administered 2012-11-17: 2 via NASAL
  Filled 2012-11-16: qty 16

## 2012-11-16 MED ORDER — DEXAMETHASONE SODIUM PHOSPHATE 10 MG/ML IJ SOLN
INTRAMUSCULAR | Status: DC | PRN
  Administered 2012-11-16: 10 mg via INTRAVENOUS

## 2012-11-16 MED ORDER — ONDANSETRON HCL 4 MG/2ML IJ SOLN: 4.0000 mg | Freq: Four times a day (QID) | INTRAMUSCULAR | Status: DC | PRN

## 2012-11-16 MED ORDER — ONDANSETRON HCL 4 MG/2ML IJ SOLN
INTRAMUSCULAR | Status: DC | PRN
  Administered 2012-11-16: 4 mg via INTRAVENOUS

## 2012-11-16 MED ORDER — CEFAZOLIN SODIUM-DEXTROSE 2-3 GM-% IV SOLR
INTRAVENOUS | Status: AC
  Filled 2012-11-16: qty 50

## 2012-11-16 MED ORDER — LIOTHYRONINE SODIUM 25 MCG PO TABS
25.0000 ug | ORAL_TABLET | Freq: Every day | ORAL | Status: DC
  Administered 2012-11-17 – 2012-11-18 (×2): 25 ug via ORAL
  Filled 2012-11-16 (×2): qty 1

## 2012-11-16 MED ORDER — PROPOFOL 10 MG/ML IV EMUL
INTRAVENOUS | Status: AC
  Filled 2012-11-16: qty 20

## 2012-11-16 MED ORDER — NALOXONE HCL 0.4 MG/ML IJ SOLN: 0.4000 mg | INTRAMUSCULAR | Status: DC | PRN

## 2012-11-16 MED ORDER — FENTANYL CITRATE 0.05 MG/ML IJ SOLN
INTRAMUSCULAR | Status: AC
  Filled 2012-11-16: qty 5

## 2012-11-16 MED ORDER — TRAMADOL HCL 50 MG PO TABS: 50.0000 mg | ORAL_TABLET | Freq: Four times a day (QID) | ORAL | Status: DC | PRN

## 2012-11-16 MED ORDER — HYDROMORPHONE HCL PF 1 MG/ML IJ SOLN
INTRAMUSCULAR | Status: AC
  Administered 2012-11-16: 0.5 mg via INTRAVENOUS
  Filled 2012-11-16: qty 1

## 2012-11-16 MED ORDER — DIPHENHYDRAMINE HCL 12.5 MG/5ML PO ELIX: 12.5000 mg | ORAL_SOLUTION | Freq: Four times a day (QID) | ORAL | Status: DC | PRN

## 2012-11-16 MED ORDER — DIPHENHYDRAMINE HCL 50 MG/ML IJ SOLN: 12.5000 mg | Freq: Four times a day (QID) | INTRAMUSCULAR | Status: DC | PRN

## 2012-11-16 MED ORDER — SODIUM CHLORIDE 0.9 % IJ SOLN: 9.0000 mL | INTRAMUSCULAR | Status: DC | PRN

## 2012-11-16 MED ORDER — LACTATED RINGERS IV SOLN
INTRAVENOUS | Status: DC
  Administered 2012-11-16: 07:00:00 via INTRAVENOUS

## 2012-11-16 MED ORDER — GLYCOPYRROLATE 0.2 MG/ML IJ SOLN
INTRAMUSCULAR | Status: DC | PRN
  Administered 2012-11-16: 0.6 mg via INTRAVENOUS

## 2012-11-16 MED ORDER — PNEUMOCOCCAL VAC POLYVALENT 25 MCG/0.5ML IJ INJ
0.5000 mL | INJECTION | INTRAMUSCULAR | Status: AC
  Administered 2012-11-17: 0.5 mL via INTRAMUSCULAR
  Filled 2012-11-16: qty 0.5

## 2012-11-16 MED ORDER — DEXTROSE IN LACTATED RINGERS 5 % IV SOLN
INTRAVENOUS | Status: DC
  Administered 2012-11-16 – 2012-11-17 (×2): via INTRAVENOUS

## 2012-11-16 MED ORDER — BUPIVACAINE HCL (PF) 0.25 % IJ SOLN
INTRAMUSCULAR | Status: AC
  Filled 2012-11-16: qty 90

## 2012-11-16 MED ORDER — KETOROLAC TROMETHAMINE 30 MG/ML IJ SOLN
INTRAMUSCULAR | Status: AC
  Administered 2012-11-16: 30 mg via INTRAMUSCULAR
  Filled 2012-11-16: qty 1

## 2012-11-16 MED ORDER — NEOSTIGMINE METHYLSULFATE 1 MG/ML IJ SOLN
INTRAMUSCULAR | Status: DC | PRN
  Administered 2012-11-16: 4 mg via INTRAVENOUS

## 2012-11-16 MED ORDER — MIDAZOLAM HCL 2 MG/2ML IJ SOLN
INTRAMUSCULAR | Status: AC
  Filled 2012-11-16: qty 2

## 2012-11-16 MED ORDER — DEXAMETHASONE SODIUM PHOSPHATE 10 MG/ML IJ SOLN
INTRAMUSCULAR | Status: AC
  Filled 2012-11-16: qty 1

## 2012-11-16 MED ORDER — HYDROMORPHONE HCL PF 1 MG/ML IJ SOLN
0.2500 mg | INTRAMUSCULAR | Status: DC | PRN
  Administered 2012-11-16 (×5): 0.5 mg via INTRAVENOUS

## 2012-11-16 MED ORDER — TEMAZEPAM 15 MG PO CAPS: 15.0000 mg | ORAL_CAPSULE | Freq: Every evening | ORAL | Status: DC | PRN

## 2012-11-16 MED ORDER — ROCURONIUM BROMIDE 50 MG/5ML IV SOLN
INTRAVENOUS | Status: AC
  Filled 2012-11-16: qty 1

## 2012-11-16 MED ORDER — HYDROMORPHONE HCL PF 1 MG/ML IJ SOLN
INTRAMUSCULAR | Status: AC
  Filled 2012-11-16: qty 1

## 2012-11-16 MED ORDER — KETOROLAC TROMETHAMINE 30 MG/ML IJ SOLN
INTRAMUSCULAR | Status: AC
  Filled 2012-11-16: qty 1

## 2012-11-16 MED ORDER — SCOPOLAMINE 1 MG/3DAYS TD PT72
1.0000 | MEDICATED_PATCH | TRANSDERMAL | Status: DC
  Administered 2012-11-16: 1.5 mg via TRANSDERMAL

## 2012-11-16 MED ORDER — LIDOCAINE HCL (CARDIAC) 20 MG/ML IV SOLN
INTRAVENOUS | Status: DC | PRN
  Administered 2012-11-16: 80 mg via INTRAVENOUS

## 2012-11-16 MED ORDER — LEVOTHYROXINE SODIUM 75 MCG PO TABS
75.0000 ug | ORAL_TABLET | Freq: Every day | ORAL | Status: DC
  Administered 2012-11-17 – 2012-11-18 (×2): 75 ug via ORAL
  Filled 2012-11-16 (×2): qty 1

## 2012-11-16 MED ORDER — IBUPROFEN 600 MG PO TABS
600.0000 mg | ORAL_TABLET | Freq: Four times a day (QID) | ORAL | Status: DC | PRN
  Administered 2012-11-17 – 2012-11-18 (×4): 600 mg via ORAL
  Filled 2012-11-16 (×4): qty 1

## 2012-11-16 MED ORDER — LIDOCAINE HCL (CARDIAC) 20 MG/ML IV SOLN
INTRAVENOUS | Status: AC
  Filled 2012-11-16: qty 5

## 2012-11-16 MED ORDER — KETOROLAC TROMETHAMINE 30 MG/ML IJ SOLN
INTRAMUSCULAR | Status: DC | PRN
  Administered 2012-11-16: 30 mg via INTRAVENOUS

## 2012-11-16 MED ORDER — PROPRANOLOL HCL ER 120 MG PO CP24
120.0000 mg | ORAL_CAPSULE | Freq: Every day | ORAL | Status: DC
Start: 2012-11-16 — End: 2012-11-18
  Administered 2012-11-16 – 2012-11-17 (×2): 120 mg via ORAL
  Filled 2012-11-16 (×3): qty 1

## 2012-11-16 MED ORDER — KETOROLAC TROMETHAMINE 30 MG/ML IJ SOLN
30.0000 mg | Freq: Once | INTRAMUSCULAR | Status: AC
  Administered 2012-11-16: 30 mg via INTRAMUSCULAR

## 2012-11-16 MED ORDER — FENTANYL CITRATE 0.05 MG/ML IJ SOLN
INTRAMUSCULAR | Status: DC | PRN
  Administered 2012-11-16 (×3): 50 ug via INTRAVENOUS
  Administered 2012-11-16: 100 ug via INTRAVENOUS
  Administered 2012-11-16 (×2): 50 ug via INTRAVENOUS
  Administered 2012-11-16: 100 ug via INTRAVENOUS
  Administered 2012-11-16: 50 ug via INTRAVENOUS

## 2012-11-16 MED ORDER — ONDANSETRON HCL 4 MG/2ML IJ SOLN
INTRAMUSCULAR | Status: AC
  Filled 2012-11-16: qty 2

## 2012-11-16 MED ORDER — MEPERIDINE HCL 25 MG/ML IJ SOLN
INTRAMUSCULAR | Status: AC
  Filled 2012-11-16: qty 1

## 2012-11-16 MED ORDER — HYDROMORPHONE HCL PF 1 MG/ML IJ SOLN
0.5000 mg | INTRAMUSCULAR | Status: DC | PRN
  Administered 2012-11-16: 0.5 mg via INTRAVENOUS

## 2012-11-16 MED ORDER — ROCURONIUM BROMIDE 100 MG/10ML IV SOLN
INTRAVENOUS | Status: DC | PRN
  Administered 2012-11-16: 50 mg via INTRAVENOUS
  Administered 2012-11-16: 10 mg via INTRAVENOUS

## 2012-11-16 MED ORDER — PROPOFOL 10 MG/ML IV BOLUS
INTRAVENOUS | Status: DC | PRN
  Administered 2012-11-16: 200 mg via INTRAVENOUS

## 2012-11-16 MED ORDER — MENTHOL 3 MG MT LOZG: 1.0000 | LOZENGE | OROMUCOSAL | Status: DC | PRN

## 2012-11-16 MED ORDER — BUPIVACAINE HCL (PF) 0.25 % IJ SOLN
INTRAMUSCULAR | Status: DC | PRN
  Administered 2012-11-16: 10 mL

## 2012-11-16 MED ORDER — MIDAZOLAM HCL 5 MG/5ML IJ SOLN
INTRAMUSCULAR | Status: DC | PRN
  Administered 2012-11-16: 2 mg via INTRAVENOUS

## 2012-11-16 MED ORDER — SCOPOLAMINE 1 MG/3DAYS TD PT72
MEDICATED_PATCH | TRANSDERMAL | Status: AC
  Administered 2012-11-16: 1.5 mg via TRANSDERMAL
  Filled 2012-11-16: qty 1

## 2012-11-16 MED ORDER — HYDROMORPHONE 0.3 MG/ML IV SOLN
INTRAVENOUS | Status: DC
  Administered 2012-11-16: 2.3 mg via INTRAVENOUS
  Administered 2012-11-16: 0.6 mg via INTRAVENOUS
  Administered 2012-11-16: 11:00:00 via INTRAVENOUS
  Administered 2012-11-16: 0.9 mg via INTRAVENOUS
  Administered 2012-11-17: 1.8 mg via INTRAVENOUS
  Administered 2012-11-17: 1.18 mg via INTRAVENOUS
  Filled 2012-11-16 (×2): qty 25

## 2012-11-16 MED ORDER — HYDROMORPHONE HCL PF 1 MG/ML IJ SOLN
INTRAMUSCULAR | Status: DC | PRN
  Administered 2012-11-16: 1 mg via INTRAVENOUS

## 2012-11-16 MED ORDER — LACTATED RINGERS IV SOLN
INTRAVENOUS | Status: DC
  Administered 2012-11-16 (×3): via INTRAVENOUS

## 2012-11-16 MED ORDER — GLYCOPYRROLATE 0.2 MG/ML IJ SOLN
INTRAMUSCULAR | Status: AC
  Filled 2012-11-16: qty 3

## 2012-11-16 MED ORDER — KETOROLAC TROMETHAMINE 30 MG/ML IJ SOLN: 30.0000 mg | Freq: Once | INTRAMUSCULAR | Status: DC

## 2012-11-16 MED ORDER — METOCLOPRAMIDE HCL 5 MG/ML IJ SOLN: 10.0000 mg | Freq: Once | INTRAMUSCULAR | Status: DC | PRN

## 2012-11-16 MED ORDER — NEOSTIGMINE METHYLSULFATE 1 MG/ML IJ SOLN
INTRAMUSCULAR | Status: AC
  Filled 2012-11-16: qty 10

## 2012-11-16 SURGICAL SUPPLY — 45 items
ADH SKN CLS APL DERMABOND .7 (GAUZE/BANDAGES/DRESSINGS)
BRR ADH 6X5 SEPRAFILM 1 SHT (MISCELLANEOUS)
CANISTER SUCTION 2500CC (MISCELLANEOUS) ×3 IMPLANT
CHLORAPREP W/TINT 26ML (MISCELLANEOUS) ×3 IMPLANT
CLOTH BEACON ORANGE TIMEOUT ST (SAFETY) ×3 IMPLANT
DECANTER SPIKE VIAL GLASS SM (MISCELLANEOUS) ×2 IMPLANT
DERMABOND ADVANCED (GAUZE/BANDAGES/DRESSINGS)
DERMABOND ADVANCED .7 DNX12 (GAUZE/BANDAGES/DRESSINGS) ×2 IMPLANT
GAUZE SPONGE 4X4 16PLY XRAY LF (GAUZE/BANDAGES/DRESSINGS) ×1 IMPLANT
GLOVE BIO SURGEON STRL SZ 6.5 (GLOVE) ×6 IMPLANT
GLOVE BIOGEL PI IND STRL 7.0 (GLOVE) IMPLANT
GLOVE BIOGEL PI INDICATOR 7.0 (GLOVE) ×4
GLOVE ECLIPSE 6.0 STRL STRAW (GLOVE) ×2 IMPLANT
GLOVE ECLIPSE 6.5 STRL STRAW (GLOVE) ×3 IMPLANT
GLOVE SURG SS PI 7.0 STRL IVOR (GLOVE) ×5 IMPLANT
GOWN PREVENTION PLUS LG XLONG (DISPOSABLE) ×9 IMPLANT
NDL HYPO 25X1 1.5 SAFETY (NEEDLE) IMPLANT
NEEDLE HYPO 22GX1.5 SAFETY (NEEDLE) ×3 IMPLANT
NEEDLE HYPO 25X1 1.5 SAFETY (NEEDLE) IMPLANT
NS IRRIG 1000ML POUR BTL (IV SOLUTION) ×4 IMPLANT
PACK ABDOMINAL GYN (CUSTOM PROCEDURE TRAY) ×3 IMPLANT
PAD OB MATERNITY 4.3X12.25 (PERSONAL CARE ITEMS) ×3 IMPLANT
PROTECTOR NERVE ULNAR (MISCELLANEOUS) ×3 IMPLANT
SEPRAFILM MEMBRANE 5X6 (MISCELLANEOUS) IMPLANT
SPONGE LAP 18X18 X RAY DECT (DISPOSABLE) ×6 IMPLANT
STAPLER VISISTAT 35W (STAPLE) ×1 IMPLANT
SUT PDS AB 0 CT 36 (SUTURE) IMPLANT
SUT PDS AB 0 CTX 60 (SUTURE) IMPLANT
SUT PLAIN 2 0 (SUTURE) ×3
SUT PLAIN 2 0 XLH (SUTURE) IMPLANT
SUT PLAIN ABS 2-0 CT1 27XMFL (SUTURE) IMPLANT
SUT VIC AB 0 CT1 18XCR BRD8 (SUTURE) ×4 IMPLANT
SUT VIC AB 0 CT1 27 (SUTURE) ×12
SUT VIC AB 0 CT1 27XBRD ANBCTR (SUTURE) ×8 IMPLANT
SUT VIC AB 0 CT1 8-18 (SUTURE) ×9
SUT VIC AB 3-0 PS1 18 (SUTURE)
SUT VIC AB 3-0 PS1 18X BRD (SUTURE) IMPLANT
SUT VIC AB 4-0 KS 27 (SUTURE) ×3 IMPLANT
SUT VICRYL 0 TIES 12 18 (SUTURE) ×3 IMPLANT
SYR CONTROL 10ML LL (SYRINGE) IMPLANT
SYR TB 1ML 25GX5/8 (SYRINGE) ×1 IMPLANT
SYRINGE 10CC LL (SYRINGE) ×3 IMPLANT
TOWEL OR 17X24 6PK STRL BLUE (TOWEL DISPOSABLE) ×6 IMPLANT
TRAY FOLEY CATH 14FR (SET/KITS/TRAYS/PACK) ×3 IMPLANT
WATER STERILE IRR 1000ML POUR (IV SOLUTION) ×3 IMPLANT

## 2012-11-16 NOTE — Brief Op Note (Signed)
11/16/2012  9:42 AM  PATIENT:  Daisy Ramirez  41 y.o. female  PRE-OPERATIVE DIAGNOSIS:  Dysfunctional Uterine Bleeding, pelvic pain  POST-OPERATIVE DIAGNOSIS:  Dysfunctional Uterine Bleeding, pelvic pain  PROCEDURE:  Procedure(s) (LRB) with comments: HYSTERECTOMY ABDOMINAL (N/A) SALPINGO OOPHORECTOMY (Bilateral)  SURGEON:  Surgeon(s) and Role:    * Jeani Hawking, MD - Primary    * Mitchel Honour, DO - Assisting  PHYSICIAN ASSISTANT:   ASSISTANTS: none   ANESTHESIA:   general  EBL:  Total I/O In: 1700 [I.V.:1700] Out: 400 [Urine:100; Blood:300]  BLOOD ADMINISTERED:none  DRAINS: Urinary Catheter (Foley)   LOCAL MEDICATIONS USED:  MARCAINE     SPECIMEN:  Source of Specimen:  uterus cervix tubes and ovaries  DISPOSITION OF SPECIMEN:  PATHOLOGY  COUNTS:  YES  TOURNIQUET:  * No tourniquets in log *  DICTATION: .Other Dictation: Dictation Number [id number]  PLAN OF CARE: Admit to inpatient   PATIENT DISPOSITION:  PACU - hemodynamically stable.   Delay start of Pharmacological VTE agent (>24hrs) due to surgical blood loss or risk of bleeding: not applicable

## 2012-11-16 NOTE — Anesthesia Postprocedure Evaluation (Signed)
  Anesthesia Post-op Note  Patient: Daisy Ramirez  Procedure(s) Performed: Procedure(s) (LRB) with comments: HYSTERECTOMY ABDOMINAL (N/A) SALPINGO OOPHORECTOMY (Bilateral)  Patient Location: PACU  Anesthesia Type:General  Level of Consciousness: awake, alert  and oriented  Airway and Oxygen Therapy: Patient Spontanous Breathing  Post-op Pain: moderate  Post-op Assessment: Post-op Vital signs reviewed, Patient's Cardiovascular Status Stable, Respiratory Function Stable, Patent Airway and No signs of Nausea or vomiting  Post-op Vital Signs: Reviewed and stable  Complications: No apparent anesthesia complications

## 2012-11-16 NOTE — Addendum Note (Signed)
Addendum  created 11/16/12 1740 by Armanda Heritage, RN   Modules edited:Charges VN, Notes Section

## 2012-11-16 NOTE — Transfer of Care (Signed)
Immediate Anesthesia Transfer of Care Note  Patient: Daisy Ramirez  Procedure(s) Performed: Procedure(s) (LRB) with comments: HYSTERECTOMY ABDOMINAL (N/A) SALPINGO OOPHORECTOMY (Bilateral)  Patient Location: PACU  Anesthesia Type:General  Level of Consciousness: awake, alert , oriented and patient cooperative  Airway & Oxygen Therapy: Patient Spontanous Breathing and Patient connected to nasal cannula oxygen  Post-op Assessment: Report given to PACU RN  Post vital signs: Reviewed  Complications: No apparent anesthesia complications

## 2012-11-16 NOTE — Anesthesia Postprocedure Evaluation (Signed)
  Anesthesia Post-op Note  Patient: Daisy Ramirez  Procedure(s) Performed: Procedure(s) (LRB) with comments: HYSTERECTOMY ABDOMINAL (N/A) SALPINGO OOPHORECTOMY (Bilateral)  Patient Location: PACU  Anesthesia Type:General  Level of Consciousness: awake, alert  and oriented  Airway and Oxygen Therapy: Patient Spontanous Breathing  Post-op Pain: mild  Post-op Assessment: Post-op Vital signs reviewed  Post-op Vital Signs: Reviewed and stable  Complications: No apparent anesthesia complications

## 2012-11-16 NOTE — Op Note (Signed)
NAMEKITTEN, BAEDER             ACCOUNT NO.:  [id number]  MEDICAL RECORD NO.:  [id number]  LOCATION:  9318                          FACILITY:  [facility]  PHYSICIAN:  Daana Petrasek L. Sherita Decoste, M.D.DATE OF BIRTH:  Jul 14, 1971  DATE OF PROCEDURE:  11/16/2012 DATE OF DISCHARGE:                              OPERATIVE REPORT   PREOPERATIVE DIAGNOSIS:  Pelvic pain and dysfunctional uterine bleeding.  POSTOPERATIVE DIAGNOSIS:  Pelvic pain and dysfunctional uterine bleeding.  PROCEDURE:  TAH and BSO.  SURGEON:  Yvonnia Tango L. Vincente Poli, MD  ASSISTANT:  Dr. Mitchel Honour.  EBL:  300 mL.  COMPLICATIONS:  None.  PATHOLOGY:  Uterus, cervix, tubes, and ovaries sent to Pathology.  PROCEDURE:  The patient was taken to the operating room.  She was intubated.  She was prepped and draped.  A Foley catheter was inserted. After time-out was performed, a low transverse incision was made, carried down to the fascia.  Fascia scored in the midline and extended laterally.  Peritoneum was entered bluntly.  The peritoneal incision was then stretched.  Self-retaining retractor was inserted into the abdominal cavity.  Large and small bowel were placed in the upper abdomen.  Exam of the pelvis revealed the uterus was slightly enlarged and there were some small myomas and ovaries consistent with PCOS. There was no evidence of endometriosis or adhesions.  We then placed Kelly clamps across the triple pedicle, elevated the uterus, identified the round ligament on the right side.  Suture ligated the round ligaments with 0 Vicryl suture, developed anterior and posterior leaves of broad ligament in standard fashion using Metzenbaum scissors, created an avascular window just beneath the right IP ligament, placed a curved Heaney clamp across this with careful attention to the ureter well below where we were.  The pedicle was clamped, cut, and suture ligated using 0 Vicryl suture and further secured using a free tie of 0 Vicryl  suture. This was done on the right side of the pelvis and done on the left side of pelvis identically.  After that the bladder flap was created sharply with Metzenbaum scissors.  We then skeletonized the uterine arteries in standard fashion.  Curved Heaney clamps were placed across each uterine artery at the level of the internal os.  The pedicle was clamped, cut, and suture ligated using 0 Vicryl suture.  The uterus was then elevated even more with careful attention that her bladder was well away from our clamps.  We used a series of straight clamps, staying snug beside the cervix, clamping the uterosacral cardinal ligaments.  Each pedicle was clamped, cut, and suture ligated using 0 Vicryl suture.  The cervix was a little long.  We had to do this a few times on each side.  Once we reached the level of the external os, then we placed a series of curved Heaney clamps just beneath the external os.  The specimen was removed, identified the cervix, uterus, tubes, and ovaries.  Angle stitches were placed using 0 Vicryl suture.  The remainder of the cuff was closed using a running locked stitch using 0 Vicryl suture.  Irrigation was performed.  Hemostasis was excellent.  The self-retaining retractor was removed and laparotomy pads  were removed.  The peritoneum was closed using 0 Vicryl and a running stitch.  The fascia was closed using 0 Vicryl and running stitch x2 starting each corner, meeting in the midline.  After irrigation of subcutaneous layer, the subcu was closed with plain gut suture interrupted and the skin was closed with 3-0 Vicryl on a Keith needle.  Dermabond was applied.  Local was infiltrated.  All sponge, lap, and instrument counts were correct x2. The patient went to recovery room in stable condition.     Kiyoko Mcguirt L. Vincente Poli, M.D.     Florestine Avers  D:  11/16/2012  T:  11/16/2012  Job:  [id number]

## 2012-11-16 NOTE — Progress Notes (Signed)
History and physical on the chart. No changes Proceed with TAH BSO Consent signed.

## 2012-11-16 NOTE — Preoperative (Signed)
Beta Blockers   Reason not to administer Beta Blockers:Not Applicable 

## 2012-11-16 NOTE — Anesthesia Preprocedure Evaluation (Addendum)
Anesthesia Evaluation  Patient identified by MRN, date of birth, ID band Patient awake    Reviewed: Allergy & Precautions, H&P , NPO status , Patient's Chart, lab work & pertinent test results  Airway Mallampati: II TM Distance: >3 FB Neck ROM: full    Dental No notable dental hx. (+) Teeth Intact and Chipped   Pulmonary asthma ,  breath sounds clear to auscultation  Pulmonary exam normal       Cardiovascular negative cardio ROS  Rhythm:regular Rate:Normal     Neuro/Psych  Headaches, PSYCHIATRIC DISORDERS    GI/Hepatic negative GI ROS, Neg liver ROS,   Endo/Other  Hypothyroidism Hashimoto's Thyroiditis PCOS  Renal/GU      Musculoskeletal   Abdominal Normal abdominal exam  (+)   Peds  Hematology negative hematology ROS (+)   Anesthesia Other Findings   Reproductive/Obstetrics negative OB ROS Pelvic Pain DUB                          Anesthesia Physical Anesthesia Plan  ASA: II  Anesthesia Plan: General ETT   Post-op Pain Management:    Induction:   Airway Management Planned:   Additional Equipment:   Intra-op Plan:   Post-operative Plan:   Informed Consent: I have reviewed the patients History and Physical, chart, labs and discussed the procedure including the risks, benefits and alternatives for the proposed anesthesia with the patient or authorized representative who has indicated his/her understanding and acceptance.   Dental Advisory Given  Plan Discussed with: Anesthesiologist, CRNA and Surgeon  Anesthesia Plan Comments:         Anesthesia Quick Evaluation

## 2012-11-16 NOTE — Progress Notes (Signed)
Call to Dr Malen Gauze to discuss pain control issues.  Order received for Toradol 30 mg IM x 1 now.

## 2012-11-16 NOTE — Progress Notes (Signed)
Dr Malen Gauze notified of continued pain level 7 despite Dilaudid 2 mg and Demerol 12.5 mg IV.  Order received for additional Dilaudid 1 mg IV.

## 2012-11-17 ENCOUNTER — Encounter (HOSPITAL_COMMUNITY): Payer: Self-pay | Admitting: Obstetrics and Gynecology

## 2012-11-17 LAB — BASIC METABOLIC PANEL
BUN: 3 mg/dL — ABNORMAL LOW (ref 6–23)
Chloride: 103 mEq/L (ref 96–112)
Creatinine, Ser: 0.53 mg/dL (ref 0.50–1.10)
GFR calc Af Amer: >90 mL/min (ref >90)
GFR calc non Af Amer: >90 mL/min (ref >90)
Glucose, Bld: 170 mg/dL — ABNORMAL HIGH (ref 70–99)

## 2012-11-17 LAB — CBC
HCT: 32.3 % — ABNORMAL LOW (ref 36.0–46.0)
MCH: 30.3 pg (ref 26.0–34.0)
MCHC: 33.4 g/dL (ref 30.0–36.0)
RDW: 12.9 % (ref 11.5–15.5)

## 2012-11-17 MED ORDER — OXYCODONE-ACETAMINOPHEN 5-325 MG PO TABS
1.0000 | ORAL_TABLET | ORAL | Status: DC | PRN
  Administered 2012-11-17 (×2): 2 via ORAL
  Administered 2012-11-17 – 2012-11-18 (×2): 1 via ORAL
  Filled 2012-11-17 (×4): qty 1
  Filled 2012-11-17: qty 2

## 2012-11-17 NOTE — Progress Notes (Signed)
1 Day Post-Op Procedure(s) (LRB): HYSTERECTOMY ABDOMINAL (N/A) SALPINGO OOPHORECTOMY (Bilateral)  Subjective: Patient reports incisional pain, tolerating PO, + flatus and no problems voiding.    Objective: I have reviewed patient's vital signs, intake and output and medications.  General: alert, cooperative and appears stated age Vaginal Bleeding: none Abdomen is soft and non tender Incision is clean dry and intact  Assessment: s/p Procedure(s) (LRB) with comments: HYSTERECTOMY ABDOMINAL (N/A) SALPINGO OOPHORECTOMY (Bilateral): stable, progressing well and tolerating diet  Plan: Advance diet Encourage ambulation Advance to PO medication Discontinue IV fluids  LOS: 1 day    Tamario Heal L 11/17/2012, 8:41 AM

## 2012-11-18 MED ORDER — IBUPROFEN 600 MG PO TABS: 600.0000 mg | ORAL_TABLET | Freq: Four times a day (QID) | ORAL | Status: DC | PRN

## 2012-11-18 MED ORDER — OXYCODONE-ACETAMINOPHEN 5-325 MG PO TABS: 1.0000 | ORAL_TABLET | ORAL | Status: DC | PRN

## 2012-11-18 NOTE — Discharge Summary (Signed)
Admission Diagnosis: Pelvic Pain PCOS Abnormal uterine bleeding  Discharge Diagnosis: Same  Hospital Course: 41 year old female with chronic pelvic pain. She underwent uncomplicated TAH and BSO. By POD #1 she was ambulating and voiding and tolerating regular diet. She was discharged home on POD #2 in good condition. She was given RX Percocet and RX Ibuprofen to use prn pain. No driving for 1 week No intercourse for 6 weeks Call for Temp greater than 100.5, nausea or vomiting, redness or drainage from incision site. Follow up with Dr. Vincente Poli in 2 weeks.

## 2012-11-18 NOTE — Progress Notes (Signed)
2 Days Post-Op Procedure(s) (LRB): HYSTERECTOMY ABDOMINAL (N/A) SALPINGO OOPHORECTOMY (Bilateral)  Subjective: Patient reports tolerating PO, + flatus and no problems voiding.    Objective: I have reviewed patient's vital signs, intake and output, medications and labs.  General: alert, cooperative and appears stated age Vaginal Bleeding: none Abdomen is soft and incision is healing well  Assessment: s/p Procedure(s) (LRB) with comments: HYSTERECTOMY ABDOMINAL (N/A) SALPINGO OOPHORECTOMY (Bilateral): stable and progressing well  Plan: Discharge home Follow up in 2 weeks  LOS: 2 days    Jonathon Tan L 11/18/2012, 8:22 AM

## 2012-11-27 DIAGNOSIS — Z0271 Encounter for disability determination: Secondary | ICD-10-CM

## 2013-03-18 ENCOUNTER — Other Ambulatory Visit: Payer: Self-pay | Admitting: Family Medicine

## 2013-03-19 NOTE — Telephone Encounter (Signed)
Needs OV, labs.  No TSH in epic

## 2013-03-21 ENCOUNTER — Other Ambulatory Visit (INDEPENDENT_AMBULATORY_CARE_PROVIDER_SITE_OTHER): Payer: BC Managed Care – PPO

## 2013-03-21 DIAGNOSIS — R5383 Other fatigue: Secondary | ICD-10-CM

## 2013-03-21 LAB — TSH: TSH: 0.386 u[IU]/mL (ref 0.350–4.500)

## 2013-03-21 NOTE — Progress Notes (Signed)
Patient here for labs only. 

## 2013-03-22 ENCOUNTER — Encounter: Payer: Self-pay | Admitting: Family Medicine

## 2013-04-23 ENCOUNTER — Other Ambulatory Visit: Payer: Self-pay | Admitting: Physician Assistant

## 2013-06-01 ENCOUNTER — Other Ambulatory Visit: Payer: Self-pay | Admitting: Physician Assistant

## 2014-03-01 ENCOUNTER — Other Ambulatory Visit: Payer: Self-pay | Admitting: Obstetrics and Gynecology

## 2014-03-02 ENCOUNTER — Ambulatory Visit (INDEPENDENT_AMBULATORY_CARE_PROVIDER_SITE_OTHER): Payer: 59 | Admitting: Physician Assistant

## 2014-03-02 VITALS — BP 140/90 | HR 102 | Temp 98.8°F | Resp 18 | Ht 66.5 in | Wt 202.0 lb

## 2014-03-02 DIAGNOSIS — J040 Acute laryngitis: Secondary | ICD-10-CM

## 2014-03-02 DIAGNOSIS — R05 Cough: Secondary | ICD-10-CM

## 2014-03-02 DIAGNOSIS — J029 Acute pharyngitis, unspecified: Secondary | ICD-10-CM

## 2014-03-02 DIAGNOSIS — J019 Acute sinusitis, unspecified: Secondary | ICD-10-CM

## 2014-03-02 DIAGNOSIS — R059 Cough, unspecified: Secondary | ICD-10-CM

## 2014-03-02 LAB — POCT RAPID STREP A (OFFICE): Rapid Strep A Screen: NEGATIVE

## 2014-03-02 LAB — POCT INFLUENZA A/B
Influenza A, POC: NEGATIVE
Influenza B, POC: NEGATIVE

## 2014-03-02 MED ORDER — LEVOFLOXACIN 500 MG PO TABS: 500.0000 mg | ORAL_TABLET | Freq: Every day | ORAL | Status: DC

## 2014-03-02 MED ORDER — PREDNISONE 20 MG PO TABS: ORAL_TABLET | ORAL | Status: DC

## 2014-03-02 MED ORDER — HYDROCOD POLST-CHLORPHEN POLST 10-8 MG/5ML PO LQCR: 5.0000 mL | Freq: Two times a day (BID) | ORAL | Status: DC | PRN

## 2014-03-02 NOTE — Progress Notes (Signed)
Subjective:    Patient ID: Daisy Ramirez, female    DOB: [birth year], 43 y.o.   MRN: [id number]  HPI Primary Physician: No PCP Per Patient  Chief Complaint: URI x 3 days  HPI: 43 y.o. female with history below presents with 3 day history of sore throat, cough, nasal congestion, bilateral otalgia, muffled hearing, post nasal hearing, rhinorrhea, sinus pressure, headaches, chills, and fatigue. She lost her voice the previous evening. No fever. Cough was initially productive, but is now not productive. No SOB or wheezing. Ears feel like they need to pop, but will not. Headache is located along the frontal sinus. Had the same illness the previous year and states it took a month to get better. Her fiance has been sick with an upper respiratory illness. Has been trying Mucinex DM.    Past Medical History  Diagnosis Date  . PCOS (polycystic ovarian syndrome) 2010    followed by Dr. Candie Mile  . Hashimoto's thyroiditis   . Hypothyroidism   . Asthma     no inhaler  . Headache(784.0)   . Hypertension   . Allergy      Home Meds: Prior to Admission medications   Medication Sig Start Date End Date Taking? Authorizing Provider  Cholecalciferol (VITAMIN D3) 5000 UNITS CAPS Take 1 capsule by mouth daily.   Yes Historical Provider, MD  estrogens conjugated, synthetic B, (ENJUVIA) 0.9 MG tablet Take by mouth daily.   Yes Historical Provider, MD  fexofenadine-pseudoephedrine (ALLEGRA-D) 60-120 MG per tablet Take 1 tablet by mouth daily.    Yes Historical Provider, MD  fluticasone (FLONASE) 50 MCG/ACT nasal spray Place 2 sprays into the nose daily as needed. For allergies   Yes Historical Provider, MD  ibuprofen (ADVIL,MOTRIN) 600 MG tablet Take 1 tablet (600 mg total) by mouth every 6 (six) hours as needed (mild pain). 11/18/12  Yes Cyril Mourning, MD  levothyroxine (SYNTHROID, LEVOTHROID) 100 MCG tablet Take 100 mcg by mouth daily before breakfast.   Yes Historical Provider, MD  liothyronine  (CYTOMEL) 25 MCG tablet Take 25 mcg by mouth daily.   Yes Historical Provider, MD  phentermine 37.5 MG capsule Take 37.5 mg by mouth daily.   Yes Historical Provider, MD  propranolol (INDERAL LA) 120 MG 24 hr capsule Take 120 mg by mouth daily.   Yes Historical Provider, MD  temazepam (RESTORIL) 30 MG capsule Take 30 mg by mouth at bedtime.    Yes Historical Provider, MD    Allergies: No Known Allergies  History   Social History  . Marital Status: Single    Spouse Name: N/A    Number of Children: N/A  . Years of Education: N/A   Occupational History  . Not on file.   Social History Main Topics  . Smoking status: Never Smoker   . Smokeless tobacco: Not on file  . Alcohol Use: Yes     Comment: 1-2 wines per night  . Drug Use: No  . Sexual Activity: Not on file   Other Topics Concern  . Not on file   Social History Narrative  . No narrative on file     Review of Systems  Constitutional: Positive for chills and fatigue. Negative for fever.  HENT: Positive for congestion, ear pain, hearing loss, postnasal drip, rhinorrhea, sinus pressure and sore throat.        Nasal congestion.   Eyes: Negative for discharge and visual disturbance.  Respiratory: Positive for cough. Negative for shortness of breath and wheezing.  Gastrointestinal: Negative for nausea, vomiting and diarrhea.  Neurological: Positive for headaches.       Objective:   Physical Exam  Physical Exam: Blood pressure 140/90, pulse 102, temperature 98.8 F (37.1 C), temperature source Oral, resp. rate 18, height 5' 6.5" (1.689 m), weight 202 lb (91.627 kg), last menstrual period 10/20/2012, SpO2 98.00%., Body mass index is 32.12 kg/(m^2). General: Well developed, well nourished, in no acute distress. Head: Normocephalic, atraumatic, eyes without discharge, sclera non-icteric, nares are congested. Bilateral auditory canals clear, TM's are without perforation, pearly grey with reflective cone of light bilaterally.  Increased fluid behind TM's, right greater than left. Bilateral maxillary and frontal sinus TTP. Oral cavity moist, dentition normal. Posterior pharynx with post nasal drip and mild erythema. No peritonsillar abscess or tonsillar exudate. Uvula midline.  Neck: Supple. No thyromegaly. Full ROM. No lymphadenopathy. No nuchal rigidity. Lungs: Clear to auscultation bilaterally without wheezes, rales, or rhonchi. Breathing is unlabored.  Heart: RRR with S1 S2. No murmurs, rubs, or gallops appreciated. Msk:  Strength and tone normal for age. Extremities: No clubbing or cyanosis. No edema. Neuro: Alert and oriented X 3. Moves all extremities spontaneously. CNII-XII grossly in tact. Psych:  Responds to questions appropriately with a normal affect.   Labs: Results for orders placed in visit on 03/02/14  POCT INFLUENZA A/B      Result Value Ref Range   Influenza A, POC Negative     Influenza B, POC Negative    POCT RAPID STREP A (OFFICE)      Result Value Ref Range   Rapid Strep A Screen Negative  Negative    Throat culture pending Patient prefers no labs as she just had them drawn the previous day at her CPE, no results available      Assessment & Plan:  43 year old female with sinusitis, laryngitis, pharyngitis, and cough -Levaquin 500 mg 1 po daily #10 no RF -Prednisone 20 mg #18 3x3, 2x3, 1x3 no RF -Tussionex 1 tsp po q 12 hours prn cough #90 mL no RF -Voice rest -Rest -RTC precautions   Christell Faith, MHS, PA-C [facility] [place], [place] [address] [facility] 03/02/2014 8:10 AM

## 2014-03-04 LAB — CULTURE, GROUP A STREP: Organism ID, Bacteria: NORMAL

## 2014-07-06 ENCOUNTER — Ambulatory Visit (INDEPENDENT_AMBULATORY_CARE_PROVIDER_SITE_OTHER): Payer: 59 | Admitting: Emergency Medicine

## 2014-07-06 VITALS — BP 136/84 | HR 74 | Temp 98.6°F | Resp 17 | Ht 65.5 in | Wt 201.0 lb

## 2014-07-06 DIAGNOSIS — R51 Headache: Secondary | ICD-10-CM

## 2014-07-06 DIAGNOSIS — E038 Other specified hypothyroidism: Secondary | ICD-10-CM

## 2014-07-06 DIAGNOSIS — R519 Headache, unspecified: Secondary | ICD-10-CM

## 2014-07-06 MED ORDER — BUTALBITAL-APAP-CAFFEINE 50-325-40 MG PO TABS: 1.0000 | ORAL_TABLET | Freq: Four times a day (QID) | ORAL | Status: DC | PRN

## 2014-07-06 NOTE — Patient Instructions (Signed)

## 2014-07-06 NOTE — Progress Notes (Signed)
   Subjective:    Patient ID: Daisy Ramirez, female    DOB: 06-07-1971, 43 y.o.   MRN: [id number] This chart was scribed for Daisy Queen, MD by Vernell Barrier, Medical Scribe. The patient was seen in room 12. This patient's care was started at 3:18 PM.  Headache  Associated symptoms include dizziness and photophobia.   HPI Comments: [name] is a 43 y.o. female w/ hx of hyserectomy presents to the [facility] complaining of constant sharp HA for the past 1 month. Describes a burning pain in the ears and face as if its sinus related but also gets sharp frontal and occipital pain as if it is a migraine. Associated intermittent dizziness and photophobia. No OTC or prescription medication are providing any relief. States pain is present from the time she wakes up until the time she goes to bed. Reports some stressed from upcoming wedding in October. Taking Propanolol for HTN. Eyes were checked 2-3 months ago; said vision was fine. Given mild strength glasses to wear for seeing far away objects. Has not been to a neurologist in years. Does not have a regular physician or endocrinologist; seeking appropriate referral.    Review of Systems  Eyes: Positive for photophobia.  Neurological: Positive for dizziness and headaches.    Objective:  Physical Exam CONSTITUTIONAL: Well developed/well nourished HEAD: Normocephalic/atraumatic EYES: EOMI/PERRL; mild photophobia ENMT: Mucous membranes moist NECK: supple no meningeal signs; mild tenderness over the thyroid SPINE:entire spine nontender CV: S1/S2 noted, no murmurs/rubs/gallops noted LUNGS: Lungs are clear to auscultation bilaterally, no apparent distress ABDOMEN: soft, nontender, no rebound or guarding GU:no cva tenderness NEURO: Pt is awake/alert, moves all extremitiesx4; Negative Romberg; normal DTR reflexes  EXTREMITIES: pulses normal, full ROM SKIN: warm, color normal PSYCH: no abnormalities of mood  noted  BP @ 3:31 PM: sitting; 130/86 Assessment & Plan:  I did not find anything focal on her exam. I made a referral to an endocrinologist to followup on her hypothyroidism. She will be scheduled for a CT of the head and neurological evaluation of these persistent headaches. I did tell her to stop her phenteramine. She was given a prescription for Fioricet for headache  I personally performed the services described in this documentation, which was scribed in my presence. The recorded information has been reviewed and is accurate.

## 2014-07-11 ENCOUNTER — Ambulatory Visit
Admission: RE | Admit: 2014-07-11 | Discharge: 2014-07-11 | Disposition: A | Discharge status: Home / Self Care | Payer: 59 | Source: Ambulatory Visit | Attending: Emergency Medicine | Admitting: Emergency Medicine

## 2014-07-11 DIAGNOSIS — R519 Headache, unspecified: Secondary | ICD-10-CM

## 2014-07-11 DIAGNOSIS — R51 Headache: Principal | ICD-10-CM

## 2014-07-16 ENCOUNTER — Encounter: Payer: Self-pay | Admitting: Neurology

## 2014-07-16 ENCOUNTER — Ambulatory Visit (INDEPENDENT_AMBULATORY_CARE_PROVIDER_SITE_OTHER): Payer: 59 | Admitting: Neurology

## 2014-07-16 VITALS — BP 130/88 | HR 70 | Ht 67.0 in | Wt 203.0 lb

## 2014-07-16 DIAGNOSIS — G43011 Migraine without aura, intractable, with status migrainosus: Secondary | ICD-10-CM

## 2014-07-16 MED ORDER — METHYLPREDNISOLONE (PAK) 4 MG PO TABS: ORAL_TABLET | ORAL | Status: DC

## 2014-07-16 NOTE — Patient Instructions (Signed)
Overall you are doing fairly well but I do want to suggest a few things today:   Remember to drink plenty of fluid, eat healthy meals and do not skip any meals. Try to eat protein with a every meal and eat a healthy snack such as fruit or nuts in between meals. Try to keep a regular sleep-wake schedule and try to exercise daily, particularly in the form of walking, 20-30 minutes a day, if you can.   As far as your medications are concerned, I would like to suggest the following: 1)Please try a medrol steroid dose pack to help treat your headache symptoms  Please call us in 4-5 days if your symptoms do not improve  Follow up as needed. Please call us with any interim questions, concerns, problems, updates or refill requests.   My clinical assistant and will answer any of your questions and relay your messages to me and also relay most of my messages to you.   Our phone number is [phone]. We also have an after hours call service for urgent matters and there is a physician on-call for urgent questions. For any emergencies you know to call 911 or go to the nearest emergency room

## 2014-07-16 NOTE — Progress Notes (Signed)
[facility]    Provider:  Dr Janann Colonel Referring Provider: Darlyne Russian, MD Primary Care Physician:  No PCP Per Patient  CC:  headache  HPI:  Daisy Ramirez is a 43 y.o. female here as a referral from Dr. Everlene Farrier for headache evaluaton  Started around 6 weeks ago. Headache is frontal and occipital. Described as a pounding type pain. Pain gets up to a 7/10, currently a 4/10. Some nausea, no emesis. + Photo and phonophobia. Has associated tinnitus. Intermittent vertigo episodes, comes on suddenly and can last minutes to all day. No focal motor or sensory changes. With headache will have some intermittent blurry vision. Recently saw eye doctor and everything was fine. Recently given fioricet for the headache, giving minimal benefit. In the past has tried multiple triptans with no benefit. Drinks 1-2 glasses of wine a night but states it does not trigger headache. Notes increased stress recently, currently planning a wedding for October.   Prior to this has history of migraines and sinus infections. Prior to this episode would typically have 1 migraine every 2 to 3 months. Her migraines bring on vertigo attacks. Current headache is similar to migraine but they have never lasted this long. Has some vertigo and dizzy symptoms. Is not sleeping well lately. Notes some snoring, no apnea events.   Patients mother has migraines.   Review of Systems: Out of a complete 14 system review, the patient complains of only the following symptoms, and all other reviewed systems are negative. + headache, dizziness, feeling hot, flushing, joint pain, joint swelling, not enough sleep, decreased energy  History   Social History  . Marital Status: Single    Spouse Name: N/A    Number of Children: 0  . Years of Education: 12+   Occupational History  . [employer]    Social History Main Topics  . Smoking status: Never Smoker   . Smokeless tobacco: Never Used  . Alcohol Use: Yes   Comment: 1-2 wines per night  . Drug Use: No  . Sexual Activity: Not on file   Other Topics Concern  . Not on file   Social History Narrative   Patient lives at home [place].    Patient have no children.    Patient has a Best boy.    Patient is left handed.           History reviewed. No pertinent family history.  Past Medical History  Diagnosis Date  . PCOS (polycystic ovarian syndrome) 2010    followed by Dr. Candie Mile  . Hashimoto's thyroiditis   . Hypothyroidism   . Asthma     no inhaler  . Headache(784.0)   . Hypertension   . Allergy     Past Surgical History  Procedure Laterality Date  . Bilateral knee scope  1997  . Knee surgery      x 2  . Knee surgery      bilateral  . Abdominal hysterectomy  11/16/2012    Procedure: HYSTERECTOMY ABDOMINAL;  Surgeon: Cyril Mourning, MD;  Location: [facility];  Service: Gynecology;  Laterality: N/A;  . Salpingoophorectomy  11/16/2012    Procedure: SALPINGO OOPHORECTOMY;  Surgeon: Cyril Mourning, MD;  Location: [facility];  Service: Gynecology;  Laterality: Bilateral;    Current Outpatient Prescriptions  Medication Sig Dispense Refill  . butalbital-acetaminophen-caffeine (FIORICET, ESGIC) 50-325-40 MG per tablet Take 1 tablet by mouth every 6 (six) hours as needed for headache.  20 tablet  1  .  Cholecalciferol (VITAMIN D3) 5000 UNITS CAPS Take 1 capsule by mouth daily.      . fexofenadine-pseudoephedrine (ALLEGRA-D) 60-120 MG per tablet Take 1 tablet by mouth daily.       Marland Kitchen ibuprofen (ADVIL,MOTRIN) 600 MG tablet Take 1 tablet (600 mg total) by mouth every 6 (six) hours as needed (mild pain).  60 tablet  0  . propranolol (INDERAL LA) 120 MG 24 hr capsule Take 120 mg by mouth daily.      . temazepam (RESTORIL) 30 MG capsule Take 30 mg by mouth at bedtime.       Marland Kitchen thyroid (ARMOUR) 90 MG tablet Take 90 mg by mouth daily.       No current facility-administered medications for this visit.    Allergies as of  07/16/2014  . (No Known Allergies)    Vitals: BP 130/88  Pulse 70  Ht 5\' 7"  (1.702 m)  Wt 203 lb (92.08 kg)  BMI 31.79 kg/m2  LMP 10/20/2012 Last Weight:  Wt Readings from Last 1 Encounters:  07/16/14 203 lb (92.08 kg)   Last Height:   Ht Readings from Last 1 Encounters:  07/16/14 5\' 7"  (1.702 m)     Physical exam: Exam: Gen: NAD, conversant Eyes: anicteric sclerae, moist conjunctivae HENT: Atraumatic, oropharynx clear Neck: Trachea midline; supple,  Lungs: CTA, no wheezing, rales, rhonic                          CV: RRR, no MRG Abdomen: Soft, non-tender;  Extremities: No peripheral edema  Skin: Normal temperature, no rash,  Psych: Appropriate affect, pleasant  Neuro: MS: AA&Ox3, appropriately interactive, normal affect   Attention: WORLD backwards  Speech: fluent w/o paraphasic error  Memory: good recent and remote recall  CN: PERRL, VFF to FC bilat, fundoscopic exam wnl, EOMI no nystagmus, no ptosis, sensation intact to LT V1-V3 bilat, face symmetric, no weakness, hearing grossly intact, palate elevates symmetrically, shoulder shrug 5/5 bilat,  tongue protrudes midline, no fasiculations noted.  Motor: normal bulk and tone Strength: 5/5  In all extremities  Coord: rapid alternating and point-to-point (FNF, HTS) movements intact.  Reflexes: symmetrical, bilat downgoing toes  Sens: LT intact in all extremities  Gait: posture, stance, stride and arm-swing normal. Tandem gait intact. Able to walk on heels and toes. Romberg absent.   Assessment:  After physical and neurologic examination, review of laboratory studies, imaging, neurophysiology testing and pre-existing records, assessment will be reviewed on the problem list.  Plan:  Treatment plan and additional workup will be reviewed under Problem List.  1)Headache 2)Insomnia  42y/o woman presenting for initial evaluation of headache. She notes a 6 week history of continuous headache. Headache quality  is similar to prior migraines but they typically do not last this long. Had recent eye exam appointment which was overall unremarkable. Neurological exam is unremarkable. Suspect this represents a cases of status migrainosus. Will try medrol dose pack, if no improvement can consider trying IV depacon infusion. Patient will call office in 5-7 days to update me on status after using medrol dose pack.    Jim Like, DO  [facility] [address] [place], [place] [address]  Phone [phone] Fax [phone]

## 2014-07-18 ENCOUNTER — Ambulatory Visit (INDEPENDENT_AMBULATORY_CARE_PROVIDER_SITE_OTHER): Payer: 59 | Admitting: Endocrinology

## 2014-07-18 ENCOUNTER — Encounter: Payer: Self-pay | Admitting: Endocrinology

## 2014-07-18 VITALS — BP 118/82 | HR 78 | Temp 98.6°F | Ht 67.0 in | Wt 204.0 lb

## 2014-07-18 DIAGNOSIS — E039 Hypothyroidism, unspecified: Secondary | ICD-10-CM | POA: Insufficient documentation

## 2014-07-18 LAB — T4, FREE: FREE T4: 0.76 ng/dL (ref 0.60–1.60)

## 2014-07-18 LAB — TSH: TSH: 3.05 u[IU]/mL (ref 0.35–4.50)

## 2014-07-18 LAB — T3, FREE: T3 FREE: 3.2 pg/mL (ref 2.3–4.2)

## 2014-07-18 NOTE — Patient Instructions (Addendum)
You should have a bone-density test.  It would be fine to do at Dr Christen Butter office, [place], or our office.  blood tests are being requested for you today.  We'll contact you with results.  Please consider taking thyroid medication to normalize the blood tests, then see how you feel for a few months.

## 2014-07-18 NOTE — Progress Notes (Signed)
Subjective:    Patient ID: Daisy Ramirez, female    DOB: [birth year], 43 y.o.   MRN: [id number]  HPI Pt reports hypothyroidism due to hasimoto's thyroiditis was dx'ed in 1980.  She was also found to have a thyroid nodule noted then. she has been on prescribed thyroid hormone therapy since then.  She was initially rx'ed with synthroid.  However, she felt tired, so cytomel was added.  She has also been on armour thyroid +/- cytomel.  She has seen various doctors for this over the years.  she has never taken kelp or any other type of non-prescribed thyroid product.  She has had TAH.  He has never had thyroid surgery, or XRT to the neck.  He has never been on amiodarone or lithium.   She now has moderate loss of hair on the head, and assoc fatigue.   Past Medical History  Diagnosis Date  . PCOS (polycystic ovarian syndrome) 2010    followed by Dr. Candie Mile  . Hashimoto's thyroiditis   . Hypothyroidism   . Asthma     no inhaler  . Headache(784.0)   . Hypertension   . Allergy     Past Surgical History  Procedure Laterality Date  . Bilateral knee scope  1997  . Knee surgery      x 2  . Knee surgery      bilateral  . Abdominal hysterectomy  11/16/2012    Procedure: HYSTERECTOMY ABDOMINAL;  Surgeon: Cyril Mourning, MD;  Location: [facility];  Service: Gynecology;  Laterality: N/A;  . Salpingoophorectomy  11/16/2012    Procedure: SALPINGO OOPHORECTOMY;  Surgeon: Cyril Mourning, MD;  Location: [facility];  Service: Gynecology;  Laterality: Bilateral;    History   Social History  . Marital Status: Single    Spouse Name: N/A    Number of Children: 0  . Years of Education: 12+   Occupational History  . [employer]    Social History Main Topics  . Smoking status: Never Smoker   . Smokeless tobacco: Never Used  . Alcohol Use: Yes     Comment: 1-2 wines per night  . Drug Use: No  . Sexual Activity: Not on file   Other Topics Concern  . Not on file   Social History Narrative     Patient lives at home [place].    Patient have no children.    Patient has a Best boy.    Patient is left handed.           Current Outpatient Prescriptions on File Prior to Visit  Medication Sig Dispense Refill  . butalbital-acetaminophen-caffeine (FIORICET, ESGIC) 50-325-40 MG per tablet Take 1 tablet by mouth every 6 (six) hours as needed for headache.  20 tablet  1  . Cholecalciferol (VITAMIN D3) 5000 UNITS CAPS Take 1 capsule by mouth daily.      . fexofenadine-pseudoephedrine (ALLEGRA-D) 60-120 MG per tablet Take 1 tablet by mouth daily.       Marland Kitchen ibuprofen (ADVIL,MOTRIN) 600 MG tablet Take 1 tablet (600 mg total) by mouth every 6 (six) hours as needed (mild pain).  60 tablet  0  . methylPREDNIsolone (MEDROL DOSPACK) 4 MG tablet follow package directions  21 tablet  0  . propranolol (INDERAL LA) 120 MG 24 hr capsule Take 120 mg by mouth daily.      . temazepam (RESTORIL) 30 MG capsule Take 30 mg by mouth at bedtime.       Marland Kitchen thyroid (  ARMOUR) 90 MG tablet Take 90 mg by mouth daily.       No current facility-administered medications on file prior to visit.    No Known Allergies  Family History  Problem Relation Age of Onset  . Thyroid disease Neg Hx     BP 118/82  Pulse 78  Temp(Src) 98.6 F (37 C) (Oral)  Ht 5\' 7"  (1.702 m)  Wt 204 lb (92.534 kg)  BMI 31.94 kg/m2  SpO2 98%  LMP 10/20/2012     Review of Systems denies muscle cramps, sob, constipation, numbness, blurry vision, myalgias, easy bruising, and syncope.  She has heat intolerance, inability to lose weight, leg cramps, rhinorrhea, depression, and dry skin.      Objective:   Physical Exam VS: see vs page GEN: no distress HEAD: head: no deformity eyes: no periorbital swelling, no proptosis external nose and ears are normal mouth: no lesion seen NECK: supple, thyroid is not enlarged CHEST WALL: no deformity LUNGS:  Clear to auscultation CV: reg rate and rhythm, no murmur ABD: abdomen is  soft, nontender.  no hepatosplenomegaly.  not distended.  no hernia MUSCULOSKELETAL: muscle bulk and strength are grossly normal.  no obvious joint swelling.  gait is normal and steady EXTEMITIES: no deformity.  no ulcer on the feet.  feet are of normal color and temp.  no edema PULSES: dorsalis pedis intact bilat.  no carotid bruit NEURO:  cn 2-12 grossly intact.   readily moves all 4's.  sensation is intact to touch on the feet SKIN:  Normal texture and temperature.  No rash or suspicious lesion is visible.   NODES:  None palpable at the neck PSYCH: alert, well-oriented.  Does not appear anxious nor depressed.    Lab Results  Component Value Date   TSH 3.05 07/18/2014   i have reviewed the following outside records: Office notes  CXR: (2013): no mention is made of a goiter    Assessment & Plan:  Chronic hypothyroidism, new to me.  Despite normal TFT, she should change back to synthroid. Obesity: not thyroid-related Hair loss: not thyroid-related   Patient is advised the following: Patient Instructions  You should have a bone-density test.  It would be fine to do at Dr Christen Butter office, [place], or our office.  blood tests are being requested for you today.  We'll contact you with results.  Please consider taking thyroid medication to normalize the blood tests, then see how you feel for a few months.

## 2015-01-11 ENCOUNTER — Ambulatory Visit (INDEPENDENT_AMBULATORY_CARE_PROVIDER_SITE_OTHER): Payer: 59 | Admitting: Physician Assistant

## 2015-01-11 VITALS — BP 122/84 | HR 76 | Temp 99.3°F | Resp 18 | Ht 67.25 in | Wt 205.6 lb

## 2015-01-11 DIAGNOSIS — B9789 Other viral agents as the cause of diseases classified elsewhere: Secondary | ICD-10-CM

## 2015-01-11 DIAGNOSIS — E063 Autoimmune thyroiditis: Secondary | ICD-10-CM

## 2015-01-11 DIAGNOSIS — J069 Acute upper respiratory infection, unspecified: Secondary | ICD-10-CM

## 2015-01-11 LAB — TSH: TSH: 1.687 u[IU]/mL (ref 0.350–4.500)

## 2015-01-11 LAB — T4, FREE: Free T4: 0.97 ng/dL (ref 0.80–1.80)

## 2015-01-11 MED ORDER — LORATADINE-PSEUDOEPHEDRINE ER 5-120 MG PO TB12: 1.0000 | ORAL_TABLET | Freq: Two times a day (BID) | ORAL | Status: AC

## 2015-01-11 MED ORDER — IBUPROFEN 600 MG PO TABS: 600.0000 mg | ORAL_TABLET | Freq: Three times a day (TID) | ORAL | Status: DC | PRN

## 2015-01-11 MED ORDER — FLUTICASONE PROPIONATE 50 MCG/ACT NA SUSP: 2.0000 | Freq: Every day | NASAL | Status: DC

## 2015-01-11 NOTE — Progress Notes (Signed)
IDENTIFYING INFORMATION  Daisy Ramirez / DOB: Oct 04, 1971 / MRN: [id number]  The patient has HASHIMOTO'S THYROIDITIS; SMOKER; ALLERGIC RHINITIS; ASTHMA; MIGRAINES, HX OF; and Unspecified hypothyroidism on her problem list.  SUBJECTIVE  CC: Sinusitis   HPI: Daisy Ramirez is a 44 y.o. y.o. female presenting for four days of cold like symptoms. This started with ear pain and sinus pressure.  She reports having neck pain today, and now feels that it is moving to her chest. She has tried using her Allegra and this has not helped.    She reports this happens to her once or twice a year. She has feels that she needs antibiotics and prednisone and this typically works for you.     She reports a history of hypothyroidism diagnosed at age 59.  She is taking armor and says "my thyroid is all over the place."  She complains of fatigue, constipation, weight gain, and hair loss over the past few months. She thinks she would like to try synthroid.       She  has a past medical history of PCOS (polycystic ovarian syndrome) (2010); Hashimoto's thyroiditis; Hypothyroidism; Asthma; Headache(784.0); Hypertension; and Allergy.    She has a current medication list which includes the following prescription(s): butalbital-acetaminophen-caffeine prn, vitamin d3, fexofenadine, propranolol er, temazepam, thyroid.  Daisy Ramirez has No Known Allergies. She  reports that she has never smoked. She has never used smokeless tobacco. She reports that she drinks alcohol. She reports that she does not use illicit drugs. She  has no sexual activity history on file.  The patient  has past surgical history that includes bilateral knee scope (1997); Knee surgery; Knee surgery; Abdominal hysterectomy (11/16/2012); and Salpingoophorectomy (11/16/2012).  Her family history is negative for Thyroid disease.  Review of Systems  Constitutional: Positive for fever.  HENT: Positive for congestion and sore throat.   Respiratory:  Positive for cough and shortness of breath.   Cardiovascular: Negative for orthopnea and PND.  Gastrointestinal: Positive for constipation. Negative for abdominal pain and diarrhea.  Genitourinary: Negative.   Musculoskeletal: Negative.   Skin: Negative.   Neurological: Positive for dizziness.    OBJECTIVE  Blood pressure 122/84, pulse 76, temperature 99.3 F (37.4 C), temperature source Oral, resp. rate 18, height 5' 7.25" (1.708 m), weight 205 lb 9.6 oz (93.26 kg), last menstrual period 10/20/2012, SpO2 99 %. The patient's body mass index is 31.97 kg/(m^2).  Physical Exam  Constitutional: She is oriented to person, place, and time. She appears well-developed and well-nourished.  HENT:  Right Ear: Hearing, tympanic membrane, external ear and ear canal normal.  Left Ear: Hearing, tympanic membrane, external ear and ear canal normal.  Nose: Nose normal.  Mouth/Throat: Uvula is midline, oropharynx is clear and moist and mucous membranes are normal.  Neck: Normal range of motion. No thyromegaly present.  Cardiovascular: Normal rate and regular rhythm.   Respiratory: Effort normal and breath sounds normal.  Lymphadenopathy:    She has no cervical adenopathy.  Neurological: She is alert and oriented to person, place, and time.  Skin: Skin is warm, dry and intact.  Psychiatric: She has a normal mood and affect.    No results found for this or any previous visit (from the past 24 hour(s)).  ASSESSMENT & PLAN  Daisy Ramirez was seen today for sinusitis.  Diagnoses and associated orders for this visit:  Hashimoto's disease - TSH - T4, Free  Viral URI with cough - loratadine-pseudoephedrine (CLARITIN-D 12 HOUR) 5-120 MG per tablet;  Take 1 tablet by mouth 2 (two) times daily. - fluticasone (FLONASE) 50 MCG/ACT nasal spray; Place 2 sprays into both nostrils daily. Take two sprays in each nostril daily. - ibuprofen (ADVIL,MOTRIN) 600 MG tablet; Take 1 tablet (600 mg total) by mouth every 8  (eight) hours as needed.    The patient was instructed to to call or comeback to clinic as needed, or should symptoms warrant.  Philis Fendt, MHS, PA-C [facility] 01/11/2015 9:55 AM

## 2015-02-27 ENCOUNTER — Other Ambulatory Visit (HOSPITAL_COMMUNITY): Payer: Self-pay | Admitting: Sports Medicine

## 2015-02-27 DIAGNOSIS — M545 Low back pain, unspecified: Secondary | ICD-10-CM

## 2015-02-27 DIAGNOSIS — M79605 Pain in left leg: Principal | ICD-10-CM

## 2015-03-13 ENCOUNTER — Ambulatory Visit (HOSPITAL_COMMUNITY)
Admission: RE | Admit: 2015-03-13 | Discharge: 2015-03-13 | Disposition: A | Discharge status: Home / Self Care | Payer: 59 | Source: Ambulatory Visit | Attending: Sports Medicine | Admitting: Sports Medicine

## 2015-03-13 DIAGNOSIS — M5126 Other intervertebral disc displacement, lumbar region: Secondary | ICD-10-CM | POA: Insufficient documentation

## 2015-03-13 DIAGNOSIS — M79605 Pain in left leg: Secondary | ICD-10-CM

## 2015-03-13 DIAGNOSIS — M545 Low back pain: Secondary | ICD-10-CM

## 2015-05-23 ENCOUNTER — Telehealth: Payer: 59 | Admitting: Physician Assistant

## 2015-05-23 DIAGNOSIS — R399 Unspecified symptoms and signs involving the genitourinary system: Secondary | ICD-10-CM

## 2015-05-23 MED ORDER — CIPROFLOXACIN HCL 500 MG PO TABS: 500.0000 mg | ORAL_TABLET | Freq: Two times a day (BID) | ORAL | Status: DC

## 2015-05-23 NOTE — Progress Notes (Signed)
We are sorry that you are not feeling well.  Here is how we plan to help!  Based on what you shared with me it looks like you most likely have a simple urinary tract infection.  A UTI (Urinary Tract Infection) is a bacterial infection of the bladder.  Most cases of urinary tract infections are simple to treat but a key part of your care is to encourage you to drink plenty of fluids and watch your symptoms carefully.  I have prescribed Ciprofloxacin 500 mg twice a day for 5 days.  Your symptoms should gradually improve. Call us if the burning in your urine worsens, you develop worsening fever, back pain or pelvic pain or if your symptoms do not resolve after completing the antibiotic.  Urinary tract infections can be prevented by drinking plenty of water to keep your body hydrated.  Also be sure when you wipe, wipe from front to back and don't hold it in!  If possible, empty your bladder every 4 hours.  Your e-visit answers were reviewed by a board certified advanced clinical practitioner to complete your personal care plan.  Depending on the condition, your plan could have included both over the counter or prescription medications.  If there is a problem please reply  once you have received a response from your provider.  Your safety is important to us.  If you have drug allergies check your prescription carefully.    You can use MyChart to ask questions about today's visit, request a non-urgent call back, or ask for a work or school excuse.  You will get an e-mail in the next two days asking about your experience.  I hope that your e-visit has been valuable and will speed your recovery. Thank you for using e-visits.    

## 2015-06-27 ENCOUNTER — Telehealth: Payer: 59 | Admitting: Physician Assistant

## 2015-06-27 DIAGNOSIS — B9689 Other specified bacterial agents as the cause of diseases classified elsewhere: Secondary | ICD-10-CM

## 2015-06-27 DIAGNOSIS — J019 Acute sinusitis, unspecified: Principal | ICD-10-CM

## 2015-06-27 MED ORDER — AMOXICILLIN-POT CLAVULANATE 875-125 MG PO TABS: 1.0000 | ORAL_TABLET | Freq: Two times a day (BID) | ORAL | Status: DC

## 2015-06-27 NOTE — Progress Notes (Signed)
We are sorry that you are not feeling well.  Here is how we plan to help!  Based on what you have shared with me it looks like you have sinusitis.  Sinusitis is inflammation and infection in the sinus cavities of the head.  Based on your presentation I believe you most likely have Acute Bacterial sinusitis.  This is an infection caused by bacteria and is treated with antibiotics.  I have prescribed Augmentin, an antibiotic in the penicillin family, one tablet twice daily with food, for 7 days.. You may use an oral decongestant such as Mucinex D or if you have glaucoma or high blood pressure use plain Mucinex.  Saline nasal sprays help and can safely be used as often as needed for congestion. If you develop worsening sinus pain, fever or notice severe headache and vision changes, or if symptoms are not better after completion of antibiotic, please schedule an appointment with a health care provider.  You will need to see a physician or provider face-to-face to determine need for steroids as they are not prescribed through e-visits.  Sinus infections are not as easily transmitted as other respiratory infection, however we still recommend that you avoid close contact with loved ones, especially the very young and elderly.  Remember to wash your hands thoroughly throughout the day as this is the number one way to prevent the spread of infection!  Home Care:  Only take medications as instructed by your medical team.  Complete the entire course of an antibiotic.  Do not take these medications with alcohol.  A steam or ultrasonic humidifier can help congestion.  You can place a towel over your head and breathe in the steam from hot water coming from a faucet.  Avoid close contacts especially the very young and the elderly.  Cover your mouth when you cough or sneeze.  Always remember to wash your hands.  Get Help Right Away If:  You develop worsening fever or sinus pain.  You develop a severe head  ache or visual changes.  Your symptoms persist after you have completed your treatment plan.  Make sure you  Understand these instructions.  Will watch your condition.  Will get help right away if you are not doing well or get worse.  Your e-visit answers were reviewed by a board certified advanced clinical practitioner to complete your personal care plan.  Depending on the condition, your plan could have included both over the counter or prescription medications.  If there is a problem please reply  once you have received a response from your provider.  Your safety is important to [place].  If you have drug allergies check your prescription carefully.    You can use MyChart to ask questions about today's visit, request a non-urgent call back, or ask for a work or school excuse.  You will get an e-mail in the next two days asking about your experience.  I hope that your e-visit has been valuable and will speed your recovery. Thank you for using e-visits.

## 2015-08-08 ENCOUNTER — Other Ambulatory Visit: Payer: Self-pay | Admitting: Obstetrics and Gynecology

## 2015-08-09 LAB — CYTOLOGY - PAP

## 2015-09-05 ENCOUNTER — Telehealth: Payer: Self-pay | Admitting: Family Medicine

## 2015-09-05 ENCOUNTER — Encounter: Payer: Self-pay | Admitting: Family Medicine

## 2015-09-05 ENCOUNTER — Ambulatory Visit (INDEPENDENT_AMBULATORY_CARE_PROVIDER_SITE_OTHER): Payer: 59 | Admitting: Family Medicine

## 2015-09-05 VITALS — BP 122/84 | HR 108 | Temp 98.3°F | Ht 67.0 in | Wt 216.5 lb

## 2015-09-05 DIAGNOSIS — J309 Allergic rhinitis, unspecified: Secondary | ICD-10-CM

## 2015-09-05 DIAGNOSIS — E669 Obesity, unspecified: Secondary | ICD-10-CM

## 2015-09-05 DIAGNOSIS — J329 Chronic sinusitis, unspecified: Secondary | ICD-10-CM | POA: Insufficient documentation

## 2015-09-05 DIAGNOSIS — T7840XS Allergy, unspecified, sequela: Secondary | ICD-10-CM | POA: Diagnosis not present

## 2015-09-05 DIAGNOSIS — Z90710 Acquired absence of both cervix and uterus: Secondary | ICD-10-CM

## 2015-09-05 DIAGNOSIS — M1990 Unspecified osteoarthritis, unspecified site: Secondary | ICD-10-CM | POA: Insufficient documentation

## 2015-09-05 DIAGNOSIS — E039 Hypothyroidism, unspecified: Secondary | ICD-10-CM

## 2015-09-05 HISTORY — DX: Chronic sinusitis, unspecified: J32.9

## 2015-09-05 HISTORY — DX: Obesity, unspecified: E66.9

## 2015-09-05 HISTORY — DX: Acquired absence of both cervix and uterus: Z90.710

## 2015-09-05 MED ORDER — MONTELUKAST SODIUM 10 MG PO TABS: 10.0000 mg | ORAL_TABLET | Freq: Every evening | ORAL | Status: DC | PRN

## 2015-09-05 MED ORDER — PREDNISONE 20 MG PO TABS: 20.0000 mg | ORAL_TABLET | Freq: Two times a day (BID) | ORAL | Status: DC

## 2015-09-05 MED ORDER — CEFDINIR 300 MG PO CAPS: 300.0000 mg | ORAL_CAPSULE | Freq: Two times a day (BID) | ORAL | Status: AC

## 2015-09-05 NOTE — Telephone Encounter (Signed)
Received 2 Release forms from pt.    1 faxed to [facility] - scanned into system.   2. Mailed to [place] center.

## 2015-09-05 NOTE — Assessment & Plan Note (Signed)
Encouraged DASH diet, decrease po intake and increase exercise as tolerated. Needs 7-8 hours of sleep nightly. Avoid trans fats, eat small, frequent meals every 4-5 hours with lean proteins, complex carbs and healthy fats. Minimize simple carbs, GMO foods. 

## 2015-09-05 NOTE — Progress Notes (Signed)
Pre visit review using our clinic review tool, if applicable. No additional management support is needed unless otherwise documented below in the visit note. 

## 2015-09-05 NOTE — Patient Instructions (Addendum)
Start a probiotic daily such as [employer] or [employer] or online st [email or web address], [place] makes a 10 strain probiotic.  Vitamin C 500 to 1000 mg daily when respiratory illness occurs Elderberry liquid, lozenges Increase nasal saline Preventive Care for Adults A healthy lifestyle and preventive care can promote health and wellness. Preventive health guidelines for women include the following key practices.  A routine yearly physical is a good way to check with your health care provider about your health and preventive screening. It is a chance to share any concerns and updates on your health and to receive a thorough exam.  Visit your dentist for a routine exam and preventive care every 6 months. Brush your teeth twice a day and floss once a day. Good oral hygiene prevents tooth decay and gum disease.  The frequency of eye exams is based on your age, health, family medical history, use of contact lenses, and other factors. Follow your health care provider's recommendations for frequency of eye exams.  Eat a healthy diet. Foods like vegetables, fruits, whole grains, low-fat dairy products, and lean protein foods contain the nutrients you need without too many calories. Decrease your intake of foods high in solid fats, added sugars, and salt. Eat the right amount of calories for you.Get information about a proper diet from your health care provider, if necessary.  Regular physical exercise is one of the most important things you can do for your health. Most adults should get at least 150 minutes of moderate-intensity exercise (any activity that increases your heart rate and causes you to sweat) each week. In addition, most adults need muscle-strengthening exercises on 2 or more days a week.  Maintain a healthy weight. The body mass index (BMI) is a screening tool to identify possible weight problems. It provides an estimate of body fat based on height and weight. Your  health care provider can find your BMI and can help you achieve or maintain a healthy weight.For adults 20 years and older:  A BMI below 18.5 is considered underweight.  A BMI of 18.5 to 24.9 is normal.  A BMI of 25 to 29.9 is considered overweight.  A BMI of 30 and above is considered obese.  Maintain normal blood lipids and cholesterol levels by exercising and minimizing your intake of saturated fat. Eat a balanced diet with plenty of fruit and vegetables. Blood tests for lipids and cholesterol should begin at age 53 and be repeated every 5 years. If your lipid or cholesterol levels are high, you are over 50, or you are at high risk for heart disease, you may need your cholesterol levels checked more frequently.Ongoing high lipid and cholesterol levels should be treated with medicines if diet and exercise are not working.  If you smoke, find out from your health care provider how to quit. If you do not use tobacco, do not start.  Lung cancer screening is recommended for adults aged 67-80 years who are at high risk for developing lung cancer because of a history of smoking. A yearly low-dose CT scan of the lungs is recommended for people who have at least a 30-pack-year history of smoking and are a current smoker or have quit within the past 15 years. A pack year of smoking is smoking an average of 1 pack of cigarettes a day for 1 year (for example: 1 pack a day for 30 years or 2 packs a day for 15 years). Yearly screening should continue until the smoker has  stopped smoking for at least 15 years. Yearly screening should be stopped for people who develop a health problem that would prevent them from having lung cancer treatment.  If you are pregnant, do not drink alcohol. If you are breastfeeding, be very cautious about drinking alcohol. If you are not pregnant and choose to drink alcohol, do not have more than 1 drink per day. One drink is considered to be 12 ounces (355 mL) of beer, 5 ounces  (148 mL) of wine, or 1.5 ounces (44 mL) of liquor.  Avoid use of street drugs. Do not share needles with anyone. Ask for help if you need support or instructions about stopping the use of drugs.  High blood pressure causes heart disease and increases the risk of stroke. Your blood pressure should be checked at least every 1 to 2 years. Ongoing high blood pressure should be treated with medicines if weight loss and exercise do not work.  If you are 55-64 years old, ask your health care provider if you should take aspirin to prevent strokes.  Diabetes screening involves taking a blood sample to check your fasting blood sugar level. This should be done once every 3 years, after age 64, if you are within normal weight and without risk factors for diabetes. Testing should be considered at a younger age or be carried out more frequently if you are overweight and have at least 1 risk factor for diabetes.  Breast cancer screening is essential preventive care for women. You should practice "breast self-awareness." This means understanding the normal appearance and feel of your breasts and may include breast self-examination. Any changes detected, no matter how small, should be reported to a health care provider. Women in their 98s and 30s should have a clinical breast exam (CBE) by a health care provider as part of a regular health exam every 1 to 3 years. After age 63, women should have a CBE every year. Starting at age 49, women should consider having a mammogram (breast X-ray test) every year. Women who have a family history of breast cancer should talk to their health care provider about genetic screening. Women at a high risk of breast cancer should talk to their health care providers about having an MRI and a mammogram every year.  Breast cancer gene (BRCA)-related cancer risk assessment is recommended for women who have family members with BRCA-related cancers. BRCA-related cancers include breast, ovarian,  tubal, and peritoneal cancers. Having family members with these cancers may be associated with an increased risk for harmful changes (mutations) in the breast cancer genes BRCA1 and BRCA2. Results of the assessment will determine the need for genetic counseling and BRCA1 and BRCA2 testing.  Routine pelvic exams to screen for cancer are no longer recommended for nonpregnant women who are considered low risk for cancer of the pelvic organs (ovaries, uterus, and vagina) and who do not have symptoms. Ask your health care provider if a screening pelvic exam is right for you.  If you have had past treatment for cervical cancer or a condition that could lead to cancer, you need Pap tests and screening for cancer for at least 20 years after your treatment. If Pap tests have been discontinued, your risk factors (such as having a new sexual partner) need to be reassessed to determine if screening should be resumed. Some women have medical problems that increase the chance of getting cervical cancer. In these cases, your health care provider may recommend more frequent screening and Pap tests.  The HPV test is an additional test that may be used for cervical cancer screening. The HPV test looks for the virus that can cause the cell changes on the cervix. The cells collected during the Pap test can be tested for HPV. The HPV test could be used to screen women aged 13 years and older, and should be used in women of any age who have unclear Pap test results. After the age of 52, women should have HPV testing at the same frequency as a Pap test.  Colorectal cancer can be detected and often prevented. Most routine colorectal cancer screening begins at the age of 50 years and continues through age 64 years. However, your health care provider may recommend screening at an earlier age if you have risk factors for colon cancer. On a yearly basis, your health care provider may provide home test kits to check for hidden blood in  the stool. Use of a small camera at the end of a tube, to directly examine the colon (sigmoidoscopy or colonoscopy), can detect the earliest forms of colorectal cancer. Talk to your health care provider about this at age 38, when routine screening begins. Direct exam of the colon should be repeated every 5-10 years through age 76 years, unless early forms of pre-cancerous polyps or small growths are found.  People who are at an increased risk for hepatitis B should be screened for this virus. You are considered at high risk for hepatitis B if:  You were born in a country where hepatitis B occurs often. Talk with your health care provider about which countries are considered high risk.  Your parents were born in a high-risk country and you have not received a shot to protect against hepatitis B (hepatitis B vaccine).  You have HIV or AIDS.  You use needles to inject street drugs.  You live with, or have sex with, someone who has hepatitis B.  You get hemodialysis treatment.  You take certain medicines for conditions like cancer, organ transplantation, and autoimmune conditions.  Hepatitis C blood testing is recommended for all people born from [birth year] through 1965 and any individual with known risks for hepatitis C.  Practice safe sex. Use condoms and avoid high-risk sexual practices to reduce the spread of sexually transmitted infections (STIs). STIs include gonorrhea, chlamydia, syphilis, trichomonas, herpes, HPV, and human immunodeficiency virus (HIV). Herpes, HIV, and HPV are viral illnesses that have no cure. They can result in disability, cancer, and death.  You should be screened for sexually transmitted illnesses (STIs) including gonorrhea and chlamydia if:  You are sexually active and are younger than 24 years.  You are older than 24 years and your health care provider tells you that you are at risk for this type of infection.  Your sexual activity has changed since you were last  screened and you are at an increased risk for chlamydia or gonorrhea. Ask your health care provider if you are at risk.  If you are at risk of being infected with HIV, it is recommended that you take a prescription medicine daily to prevent HIV infection. This is called preexposure prophylaxis (PrEP). You are considered at risk if:  You are a heterosexual woman, are sexually active, and are at increased risk for HIV infection.  You take drugs by injection.  You are sexually active with a partner who has HIV.  Talk with your health care provider about whether you are at high risk of being infected with HIV. If you  choose to begin PrEP, you should first be tested for HIV. You should then be tested every 3 months for as long as you are taking PrEP.  Osteoporosis is a disease in which the bones lose minerals and strength with aging. This can result in serious bone fractures or breaks. The risk of osteoporosis can be identified using a bone density scan. Women ages 80 years and over and women at risk for fractures or osteoporosis should discuss screening with their health care providers. Ask your health care provider whether you should take a calcium supplement or vitamin D to reduce the rate of osteoporosis.  Menopause can be associated with physical symptoms and risks. Hormone replacement therapy is available to decrease symptoms and risks. You should talk to your health care provider about whether hormone replacement therapy is right for you.  Use sunscreen. Apply sunscreen liberally and repeatedly throughout the day. You should seek shade when your shadow is shorter than you. Protect yourself by wearing long sleeves, pants, a wide-brimmed hat, and sunglasses year round, whenever you are outdoors.  Once a month, do a whole body skin exam, using a mirror to look at the skin on your back. Tell your health care provider of new moles, moles that have irregular borders, moles that are larger than a pencil  eraser, or moles that have changed in shape or color.  Stay current with required vaccines (immunizations).  Influenza vaccine. All adults should be immunized every year.  Tetanus, diphtheria, and acellular pertussis (Td, Tdap) vaccine. Pregnant women should receive 1 dose of Tdap vaccine during each pregnancy. The dose should be obtained regardless of the length of time since the last dose. Immunization is preferred during the 27th-36th week of gestation. An adult who has not previously received Tdap or who does not know her vaccine status should receive 1 dose of Tdap. This initial dose should be followed by tetanus and diphtheria toxoids (Td) booster doses every 10 years. Adults with an unknown or incomplete history of completing a 3-dose immunization series with Td-containing vaccines should begin or complete a primary immunization series including a Tdap dose. Adults should receive a Td booster every 10 years.  Varicella vaccine. An adult without evidence of immunity to varicella should receive 2 doses or a second dose if she has previously received 1 dose. Pregnant females who do not have evidence of immunity should receive the first dose after pregnancy. This first dose should be obtained before leaving the health care facility. The second dose should be obtained 4-8 weeks after the first dose.  Human papillomavirus (HPV) vaccine. Females aged 13-26 years who have not received the vaccine previously should obtain the 3-dose series. The vaccine is not recommended for use in pregnant females. However, pregnancy testing is not needed before receiving a dose. If a female is found to be pregnant after receiving a dose, no treatment is needed. In that case, the remaining doses should be delayed until after the pregnancy. Immunization is recommended for any person with an immunocompromised condition through the age of 1 years if she did not get any or all doses earlier. During the 3-dose series, the  second dose should be obtained 4-8 weeks after the first dose. The third dose should be obtained 24 weeks after the first dose and 16 weeks after the second dose.  Zoster vaccine. One dose is recommended for adults aged 51 years or older unless certain conditions are present.  Measles, mumps, and rubella (MMR) vaccine. Adults born before  1957 generally are considered immune to measles and mumps. Adults born in [birth year] or later should have 1 or more doses of MMR vaccine unless there is a contraindication to the vaccine or there is laboratory evidence of immunity to each of the three diseases. A routine second dose of MMR vaccine should be obtained at least 28 days after the first dose for students attending postsecondary schools, health care workers, or international travelers. People who received inactivated measles vaccine or an unknown type of measles vaccine during 1963-1967 should receive 2 doses of MMR vaccine. People who received inactivated mumps vaccine or an unknown type of mumps vaccine before 1979 and are at high risk for mumps infection should consider immunization with 2 doses of MMR vaccine. For females of childbearing age, rubella immunity should be determined. If there is no evidence of immunity, females who are not pregnant should be vaccinated. If there is no evidence of immunity, females who are pregnant should delay immunization until after pregnancy. Unvaccinated health care workers born before [birth year] who lack laboratory evidence of measles, mumps, or rubella immunity or laboratory confirmation of disease should consider measles and mumps immunization with 2 doses of MMR vaccine or rubella immunization with 1 dose of MMR vaccine.  Pneumococcal 13-valent conjugate (PCV13) vaccine. When indicated, a person who is uncertain of her immunization history and has no record of immunization should receive the PCV13 vaccine. An adult aged 66 years or older who has certain medical conditions and has not  been previously immunized should receive 1 dose of PCV13 vaccine. This PCV13 should be followed with a dose of pneumococcal polysaccharide (PPSV23) vaccine. The PPSV23 vaccine dose should be obtained at least 8 weeks after the dose of PCV13 vaccine. An adult aged 62 years or older who has certain medical conditions and previously received 1 or more doses of PPSV23 vaccine should receive 1 dose of PCV13. The PCV13 vaccine dose should be obtained 1 or more years after the last PPSV23 vaccine dose.  Pneumococcal polysaccharide (PPSV23) vaccine. When PCV13 is also indicated, PCV13 should be obtained first. All adults aged 27 years and older should be immunized. An adult younger than age 31 years who has certain medical conditions should be immunized. Any person who resides in a nursing home or long-term care facility should be immunized. An adult smoker should be immunized. People with an immunocompromised condition and certain other conditions should receive both PCV13 and PPSV23 vaccines. People with human immunodeficiency virus (HIV) infection should be immunized as soon as possible after diagnosis. Immunization during chemotherapy or radiation therapy should be avoided. Routine use of PPSV23 vaccine is not recommended for American Indians, [place]atives, or people younger than 65 years unless there are medical conditions that require PPSV23 vaccine. When indicated, people who have unknown immunization and have no record of immunization should receive PPSV23 vaccine. One-time revaccination 5 years after the first dose of PPSV23 is recommended for people aged 19-64 years who have chronic kidney failure, nephrotic syndrome, asplenia, or immunocompromised conditions. People who received 1-2 doses of PPSV23 before age 80 years should receive another dose of PPSV23 vaccine at age 80 years or later if at least 5 years have passed since the previous dose. Doses of PPSV23 are not needed for people immunized with PPSV23 at  or after age 76 years.  Meningococcal vaccine. Adults with asplenia or persistent complement component deficiencies should receive 2 doses of quadrivalent meningococcal conjugate (MenACWY-D) vaccine. The doses should be obtained at least 2 months  apart. Microbiologists working with certain meningococcal bacteria, [employer] recruits, people at risk during an outbreak, and people who travel to or live in countries with a high rate of meningitis should be immunized. A first-year college student up through age 40 years who is living in a residence hall should receive a dose if she did not receive a dose on or after her 16th birthday. Adults who have certain high-risk conditions should receive one or more doses of vaccine.  Hepatitis A vaccine. Adults who wish to be protected from this disease, have certain high-risk conditions, work with hepatitis A-infected animals, work in hepatitis A research labs, or travel to or work in countries with a high rate of hepatitis A should be immunized. Adults who were previously unvaccinated and who anticipate close contact with an international adoptee during the first 60 days after arrival in the [place] from a country with a high rate of hepatitis A should be immunized.  Hepatitis B vaccine. Adults who wish to be protected from this disease, have certain high-risk conditions, may be exposed to blood or other infectious body fluids, are household contacts or sex partners of hepatitis B positive people, are clients or workers in certain care facilities, or travel to or work in countries with a high rate of hepatitis B should be immunized.  Haemophilus influenzae type b (Hib) vaccine. A previously unvaccinated person with asplenia or sickle cell disease or having a scheduled splenectomy should receive 1 dose of Hib vaccine. Regardless of previous immunization, a recipient of a hematopoietic stem cell transplant should receive a 3-dose series 6-12 months after her  successful transplant. Hib vaccine is not recommended for adults with HIV infection. Preventive Services / Frequency Ages 19 to 3 years  Blood pressure check.** / Every 1 to 2 years.  Lipid and cholesterol check.** / Every 5 years beginning at age 75.  Clinical breast exam.** / Every 3 years for women in their 58s and 70s.  BRCA-related cancer risk assessment.** / For women who have family members with a BRCA-related cancer (breast, ovarian, tubal, or peritoneal cancers).  Pap test.** / Every 2 years from ages 24 through 10. Every 3 years starting at age 10 through age 73 or 52 with a history of 3 consecutive normal Pap tests.  HPV screening.** / Every 3 years from ages 18 through ages 27 to 77 with a history of 3 consecutive normal Pap tests.  Hepatitis C blood test.** / For any individual with known risks for hepatitis C.  Skin self-exam. / Monthly.  Influenza vaccine. / Every year.  Tetanus, diphtheria, and acellular pertussis (Tdap, Td) vaccine.** / Consult your health care provider. Pregnant women should receive 1 dose of Tdap vaccine during each pregnancy. 1 dose of Td every 10 years.  Varicella vaccine.** / Consult your health care provider. Pregnant females who do not have evidence of immunity should receive the first dose after pregnancy.  HPV vaccine. / 3 doses over 6 months, if 64 and younger. The vaccine is not recommended for use in pregnant females. However, pregnancy testing is not needed before receiving a dose.  Measles, mumps, rubella (MMR) vaccine.** / You need at least 1 dose of MMR if you were born in 1957 or later. You may also need a 2nd dose. For females of childbearing age, rubella immunity should be determined. If there is no evidence of immunity, females who are not pregnant should be vaccinated. If there is no evidence of immunity, females who are pregnant should  delay immunization until after pregnancy.  Pneumococcal 13-valent conjugate (PCV13) vaccine.** /  Consult your health care provider.  Pneumococcal polysaccharide (PPSV23) vaccine.** / 1 to 2 doses if you smoke cigarettes or if you have certain conditions.  Meningococcal vaccine.** / 1 dose if you are age 13 to 30 years and a [employer] researcher living in a residence hall, or have one of several medical conditions, you need to get vaccinated against meningococcal disease. You may also need additional booster doses.  Hepatitis A vaccine.** / Consult your health care provider.  Hepatitis B vaccine.** / Consult your health care provider.  Haemophilus influenzae type b (Hib) vaccine.** / Consult your health care provider. Ages 30 to 77 years  Blood pressure check.** / Every 1 to 2 years.  Lipid and cholesterol check.** / Every 5 years beginning at age 68 years.  Lung cancer screening. / Every year if you are aged 7-80 years and have a 30-pack-year history of smoking and currently smoke or have quit within the past 15 years. Yearly screening is stopped once you have quit smoking for at least 15 years or develop a health problem that would prevent you from having lung cancer treatment.  Clinical breast exam.** / Every year after age 9 years.  BRCA-related cancer risk assessment.** / For women who have family members with a BRCA-related cancer (breast, ovarian, tubal, or peritoneal cancers).  Mammogram.** / Every year beginning at age 75 years and continuing for as long as you are in good health. Consult with your health care provider.  Pap test.** / Every 3 years starting at age 84 years through age 32 or 23 years with a history of 3 consecutive normal Pap tests.  HPV screening.** / Every 3 years from ages 34 years through ages 54 to 71 years with a history of 3 consecutive normal Pap tests.  Fecal occult blood test (FOBT) of stool. / Every year beginning at age 74 years and continuing until age 88 years. You may not need to do this test if you get a colonoscopy every 10  years.  Flexible sigmoidoscopy or colonoscopy.** / Every 5 years for a flexible sigmoidoscopy or every 10 years for a colonoscopy beginning at age 70 years and continuing until age 75 years.  Hepatitis C blood test.** / For all people born from [birth year] through 1965 and any individual with known risks for hepatitis C.  Skin self-exam. / Monthly.  Influenza vaccine. / Every year.  Tetanus, diphtheria, and acellular pertussis (Tdap/Td) vaccine.** / Consult your health care provider. Pregnant women should receive 1 dose of Tdap vaccine during each pregnancy. 1 dose of Td every 10 years.  Varicella vaccine.** / Consult your health care provider. Pregnant females who do not have evidence of immunity should receive the first dose after pregnancy.  Zoster vaccine.** / 1 dose for adults aged 33 years or older.  Measles, mumps, rubella (MMR) vaccine.** / You need at least 1 dose of MMR if you were born in 1957 or later. You may also need a 2nd dose. For females of childbearing age, rubella immunity should be determined. If there is no evidence of immunity, females who are not pregnant should be vaccinated. If there is no evidence of immunity, females who are pregnant should delay immunization until after pregnancy.  Pneumococcal 13-valent conjugate (PCV13) vaccine.** / Consult your health care provider.  Pneumococcal polysaccharide (PPSV23) vaccine.** / 1 to 2 doses if you smoke cigarettes or if you have certain conditions.  Meningococcal vaccine.** /  Consult your health care provider.  Hepatitis A vaccine.** / Consult your health care provider.  Hepatitis B vaccine.** / Consult your health care provider.  Haemophilus influenzae type b (Hib) vaccine.** / Consult your health care provider. Ages 24 years and over  Blood pressure check.** / Every 1 to 2 years.  Lipid and cholesterol check.** / Every 5 years beginning at age 73 years.  Lung cancer screening. / Every year if you are aged 47-80 years  and have a 30-pack-year history of smoking and currently smoke or have quit within the past 15 years. Yearly screening is stopped once you have quit smoking for at least 15 years or develop a health problem that would prevent you from having lung cancer treatment.  Clinical breast exam.** / Every year after age 50 years.  BRCA-related cancer risk assessment.** / For women who have family members with a BRCA-related cancer (breast, ovarian, tubal, or peritoneal cancers).  Mammogram.** / Every year beginning at age 105 years and continuing for as long as you are in good health. Consult with your health care provider.  Pap test.** / Every 3 years starting at age 72 years through age 81 or 36 years with 3 consecutive normal Pap tests. Testing can be stopped between 65 and 70 years with 3 consecutive normal Pap tests and no abnormal Pap or HPV tests in the past 10 years.  HPV screening.** / Every 3 years from ages 2 years through ages 45 or 63 years with a history of 3 consecutive normal Pap tests. Testing can be stopped between 65 and 70 years with 3 consecutive normal Pap tests and no abnormal Pap or HPV tests in the past 10 years.  Fecal occult blood test (FOBT) of stool. / Every year beginning at age 84 years and continuing until age 58 years. You may not need to do this test if you get a colonoscopy every 10 years.  Flexible sigmoidoscopy or colonoscopy.** / Every 5 years for a flexible sigmoidoscopy or every 10 years for a colonoscopy beginning at age 39 years and continuing until age 4 years.  Hepatitis C blood test.** / For all people born from [birth year] through 1965 and any individual with known risks for hepatitis C.  Osteoporosis screening.** / A one-time screening for women ages 19 years and over and women at risk for fractures or osteoporosis.  Skin self-exam. / Monthly.  Influenza vaccine. / Every year.  Tetanus, diphtheria, and acellular pertussis (Tdap/Td) vaccine.** / 1 dose of Td  every 10 years.  Varicella vaccine.** / Consult your health care provider.  Zoster vaccine.** / 1 dose for adults aged 8 years or older.  Pneumococcal 13-valent conjugate (PCV13) vaccine.** / Consult your health care provider.  Pneumococcal polysaccharide (PPSV23) vaccine.** / 1 dose for all adults aged 28 years and older.  Meningococcal vaccine.** / Consult your health care provider.  Hepatitis A vaccine.** / Consult your health care provider.  Hepatitis B vaccine.** / Consult your health care provider.  Haemophilus influenzae type b (Hib) vaccine.** / Consult your health care provider. ** Family history and personal history of risk and conditions may change your health care provider's recommendations. Document Released: 02/09/2002 Document Revised: 04/30/2014 Document Reviewed: 05/11/2011 [facility] Patient Information 2015 [place], [place]. This information is not intended to replace advice given to you by your health care provider. Make sure you discuss any questions you have with your health care provider. DASH Eating Plan DASH stands for "Dietary Approaches to Stop Hypertension." The DASH eating  plan is a healthy eating plan that has been shown to reduce high blood pressure (hypertension). Additional health benefits may include reducing the risk of type 2 diabetes mellitus, heart disease, and stroke. The DASH eating plan may also help with weight loss. WHAT DO I NEED TO KNOW ABOUT THE DASH EATING PLAN? For the DASH eating plan, you will follow these general guidelines:  Choose foods with a percent daily value for sodium of less than 5% (as listed on the food label).  Use salt-free seasonings or herbs instead of table salt or sea salt.  Check with your health care provider or pharmacist before using salt substitutes.  Eat lower-sodium products, often labeled as "lower sodium" or "no salt added."  Eat fresh foods.  Eat more vegetables, fruits, and low-fat dairy  products.  Choose whole grains. Look for the word "whole" as the first word in the ingredient list.  Choose fish and skinless chicken or [place] more often than red meat. Limit fish, poultry, and meat to 6 oz (170 g) each day.  Limit sweets, desserts, sugars, and sugary drinks.  Choose heart-healthy fats.  Limit cheese to 1 oz (28 g) per day.  Eat more home-cooked food and less restaurant, buffet, and fast food.  Limit fried foods.  Cook foods using methods other than frying.  Limit canned vegetables. If you do use them, rinse them well to decrease the sodium.  When eating at a restaurant, ask that your food be prepared with less salt, or no salt if possible. WHAT FOODS CAN I EAT? Seek help from a dietitian for individual calorie needs. Grains Whole grain or whole wheat bread. Brown rice. Whole grain or whole wheat pasta. Quinoa, bulgur, and whole grain cereals. Low-sodium cereals. Corn or whole wheat flour tortillas. Whole grain cornbread. Whole grain crackers. Low-sodium crackers. Vegetables Fresh or frozen vegetables (raw, steamed, roasted, or grilled). Low-sodium or reduced-sodium tomato and vegetable juices. Low-sodium or reduced-sodium tomato sauce and paste. Low-sodium or reduced-sodium canned vegetables.  Fruits All fresh, canned (in natural juice), or frozen fruits. Meat and Other Protein Products Ground beef (85% or leaner), grass-fed beef, or beef trimmed of fat. Skinless chicken or [place]. Ground chicken or [place]. Pork trimmed of fat. All fish and seafood. Eggs. Dried beans, peas, or lentils. Unsalted nuts and seeds. Unsalted canned beans. Dairy Low-fat dairy products, such as skim or 1% milk, 2% or reduced-fat cheeses, low-fat ricotta or cottage cheese, or plain low-fat yogurt. Low-sodium or reduced-sodium cheeses. Fats and Oils Tub margarines without trans fats. Light or reduced-fat mayonnaise and salad dressings (reduced sodium). Avocado. Safflower, olive, or canola  oils. Natural peanut or almond butter. Other Unsalted popcorn and pretzels. The items listed above may not be a complete list of recommended foods or beverages. Contact your dietitian for more options. WHAT FOODS ARE NOT RECOMMENDED? Grains White bread. White pasta. White rice. Refined cornbread. Bagels and croissants. Crackers that contain trans fat. Vegetables Creamed or fried vegetables. Vegetables in a cheese sauce. Regular canned vegetables. Regular canned tomato sauce and paste. Regular tomato and vegetable juices. Fruits Dried fruits. Canned fruit in light or heavy syrup. Fruit juice. Meat and Other Protein Products Fatty cuts of meat. Ribs, chicken wings, bacon, sausage, bologna, salami, chitterlings, fatback, hot dogs, bratwurst, and packaged luncheon meats. Salted nuts and seeds. Canned beans with salt. Dairy Whole or 2% milk, cream, half-and-half, and cream cheese. Whole-fat or sweetened yogurt. Full-fat cheeses or blue cheese. Nondairy creamers and whipped toppings. Processed cheese, cheese spreads,  or cheese curds. Condiments Onion and garlic salt, seasoned salt, table salt, and sea salt. Canned and packaged gravies. Worcestershire sauce. Tartar sauce. Barbecue sauce. Teriyaki sauce. Soy sauce, including reduced sodium. Steak sauce. Fish sauce. Oyster sauce. Cocktail sauce. Horseradish. Ketchup and mustard. Meat flavorings and tenderizers. Bouillon cubes. Hot sauce. Tabasco sauce. Marinades. Taco seasonings. Relishes. Fats and Oils Butter, stick margarine, lard, shortening, ghee, and bacon fat. Coconut, palm kernel, or palm oils. Regular salad dressings. Other Pickles and olives. Salted popcorn and pretzels. The items listed above may not be a complete list of foods and beverages to avoid. Contact your dietitian for more information. WHERE CAN I FIND MORE INFORMATION? National Heart, Lung, and Blood Institute: travelstabloid.com Document Released:  12/03/2011 Document Revised: 04/30/2014 Document Reviewed: 10/18/2013 [facility] Patient Information 2015 [place], [place]. This information is not intended to replace advice given to you by your health care provider. Make sure you discuss any questions you have with your health care provider.

## 2015-09-21 NOTE — Assessment & Plan Note (Signed)
Daily antihistamines and nasal steroids prn

## 2015-09-21 NOTE — Progress Notes (Signed)
Subjective:    Patient ID: Daisy Ramirez, female    DOB: 1971-08-14, 44 y.o.   MRN: [id number]  Chief Complaint  Patient presents with  . Establish Care    HPI Patient is in today for new patient appointment. She is struggling with recurrent sinusitis over the past couple of years and presently has significant congestion. Notes head and chest congestion as well as chills, malaise and myalgias. She reports her husband has had similar symptoms and improved after and probiotics. She has been trying Allegra-D, Mucinex and Flonase with temporary relief. She notes postnasal drip and yellow sputum are present. Her past medical history is also significant for migraines and thyroid disease as obesity and asthma. Denies CP/palp/SOB/HA/congestion/fevers/GI or GU c/o. Taking meds as prescribed  Past Medical History  Diagnosis Date  . PCOS (polycystic ovarian syndrome) 2010    followed by Dr. Candie Mile  . Hashimoto's thyroiditis   . Hypothyroidism   . Asthma     no inhaler  . Headache(784.0)   . Hypertension   . Allergy   . Migraines   . UTI (lower urinary tract infection)   . Sinusitis 09/05/2015  . S/P total hysterectomy 09/05/2015    H/o fibroids, ovarian cysts, endometriosis and dysmenorrhea  TAH with Dr Tressia Danas, November of 2013  . Obesity 09/05/2015  . 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 Bilateral knee scope  1997  . Knee surgery      x 2  . Knee surgery      bilateral  . Abdominal hysterectomy  11/16/2012    Procedure: HYSTERECTOMY ABDOMINAL;  Surgeon: Cyril Mourning, MD;  Location: [facility];  Service: Gynecology;  Laterality: N/A;  . Salpingoophorectomy  11/16/2012    Procedure: SALPINGO OOPHORECTOMY;  Surgeon: Cyril Mourning, MD;  Location: [facility];  Service: Gynecology;  Laterality:  Bilateral;    Family History  Problem Relation Age of Onset  . Thyroid disease Neg Hx   . Arthritis Mother   . Hypertension Mother   . Arthritis Father   . Hyperlipidemia Father   . Alcohol abuse Paternal Aunt   . Alcohol abuse Maternal Grandmother     liver failure  . Alcohol abuse Paternal Grandmother     liver failures  . Cancer Cousin     Breast Cancer  . Heart disease Maternal Grandfather   . Heart disease Paternal Grandfather     Social History   Social History  . Marital Status: Married    Spouse Name: N/A  . Number of Children: 0  . Years of Education: 12+   Occupational History  . Senior [employer]    Social History Main Topics  . Smoking status: Never Smoker   . Smokeless tobacco: Never Used  . Alcohol Use: Yes     Comment: 1-2 wines per night  . Drug Use: No  . Sexual Activity: Yes     Comment: lives  with husband, works in Press photographer at [employer], no dietary restrictions, minimizes dairy due to lactose intolerance   Other Topics Concern  . Not on file   Social History Narrative   Patient lives at home [place].    Patient have no children.    Patient has a Best boy.    Patient is left handed.  Outpatient Prescriptions Prior to Visit  Medication Sig Dispense Refill  . fexofenadine-pseudoephedrine (ALLEGRA-D) 60-120 MG per tablet Take 1 tablet by mouth daily.     . fluticasone (FLONASE) 50 MCG/ACT nasal spray Place 2 sprays into both nostrils daily. Take two sprays in each nostril daily. 16 g 12  . ibuprofen (ADVIL,MOTRIN) 600 MG tablet Take 1 tablet (600 mg total) by mouth every 8 (eight) hours as needed. 30 tablet 0  . propranolol (INDERAL LA) 120 MG 24 hr capsule Take 120 mg by mouth daily.    . temazepam (RESTORIL) 30 MG capsule Take 30 mg by mouth at bedtime.     Marland Kitchen thyroid (ARMOUR) 90 MG tablet Take 90 mg by mouth daily.    Marland Kitchen amoxicillin-clavulanate (AUGMENTIN) 875-125 MG per tablet Take 1 tablet by mouth 2 (two) times daily. 14  tablet 0  . butalbital-acetaminophen-caffeine (FIORICET, ESGIC) 50-325-40 MG per tablet Take 1 tablet by mouth every 6 (six) hours as needed for headache. 20 tablet 1  . Cholecalciferol (VITAMIN D3) 5000 UNITS CAPS Take 1 capsule by mouth daily.    . ciprofloxacin (CIPRO) 500 MG tablet Take 1 tablet (500 mg total) by mouth 2 (two) times daily. 10 tablet 0   No facility-administered medications prior to visit.    No Known Allergies  Review of Systems  Constitutional: Positive for malaise/fatigue. Negative for fever and chills.  HENT: Positive for congestion, ear pain and tinnitus. Negative for hearing loss.   Eyes: Negative for discharge.  Respiratory: Positive for cough and sputum production. Negative for shortness of breath.   Cardiovascular: Negative for chest pain, palpitations and leg swelling.  Gastrointestinal: Negative for heartburn, nausea, vomiting, abdominal pain, diarrhea, constipation and blood in stool.  Genitourinary: Negative for dysuria, urgency, frequency and hematuria.  Musculoskeletal: Negative for myalgias, back pain and falls.  Skin: Negative for rash.  Neurological: Positive for headaches. Negative for dizziness, sensory change, loss of consciousness and weakness.  Endo/Heme/Allergies: Negative for environmental allergies. Does not bruise/bleed easily.  Psychiatric/Behavioral: Negative for depression and suicidal ideas. The patient is not nervous/anxious and does not have insomnia.        Objective:    Physical Exam  Constitutional: She is oriented to person, place, and time. She appears well-developed and well-nourished. No distress.  HENT:  Head: Normocephalic and atraumatic.  Eyes: Conjunctivae are normal.  Neck: Neck supple. No thyromegaly present.  Cardiovascular: Normal rate, regular rhythm and normal heart sounds.   No murmur heard. Pulmonary/Chest: Effort normal and breath sounds normal. No respiratory distress.  Abdominal: Soft. Bowel sounds are  normal. She exhibits no distension and no mass. There is no tenderness.  Musculoskeletal: She exhibits no edema.  Lymphadenopathy:    She has no cervical adenopathy.  Neurological: She is alert and oriented to person, place, and time.  Skin: Skin is warm and dry.  Psychiatric: She has a normal mood and affect. Her behavior is normal.    BP 122/84 mmHg  Pulse 108  Temp(Src) 98.3 F (36.8 C) (Oral)  Ht 5\' 7"  (1.702 m)  Wt 216 lb 8 oz (98.204 kg)  BMI 33.90 kg/m2  SpO2 98%  LMP 10/20/2012 Wt Readings from Last 3 Encounters:  09/05/15 216 lb 8 oz (98.204 kg)  01/11/15 205 lb 9.6 oz (93.26 kg)  07/18/14 204 lb (92.534 kg)     Lab Results  Component Value Date   WBC 14.0* 11/17/2012   HGB 10.8* 11/17/2012   HCT 32.3* 11/17/2012   PLT 298  11/17/2012   GLUCOSE 170* 11/17/2012   ALT 33 09/05/2012   AST 23 09/05/2012   NA 136 11/17/2012   K 3.8 11/17/2012   CL 103 11/17/2012   CREATININE 0.53 11/17/2012   BUN 3* 11/17/2012   CO2 22 11/17/2012   TSH 1.687 01/11/2015   HGBA1C 5.0 09/05/2012    Lab Results  Component Value Date   TSH 1.687 01/11/2015   Lab Results  Component Value Date   WBC 14.0* 11/17/2012   HGB 10.8* 11/17/2012   HCT 32.3* 11/17/2012   MCV 90.7 11/17/2012   PLT 298 11/17/2012   Lab Results  Component Value Date   NA 136 11/17/2012   K 3.8 11/17/2012   CO2 22 11/17/2012   GLUCOSE 170* 11/17/2012   BUN 3* 11/17/2012   CREATININE 0.53 11/17/2012   BILITOT 0.3 09/05/2012   ALKPHOS 49 09/05/2012   AST 23 09/05/2012   ALT 33 09/05/2012   PROT 7.3 09/05/2012   ALBUMIN 4.4 09/05/2012   CALCIUM 8.7 11/17/2012   No results found for: CHOL No results found for: HDL No results found for: LDLCALC No results found for: TRIG No results found for: CHOLHDL Lab Results  Component Value Date   HGBA1C 5.0 09/05/2012       Assessment & Plan:   Problem List Items Addressed This Visit    Sinusitis - Primary    Started on Cefdinir. Start a  probiotic, Mucinex increase rest and hydration      Relevant Medications   predniSONE (DELTASONE) 20 MG tablet   S/P total hysterectomy   Obesity    Encouraged DASH diet, decrease po intake and increase exercise as tolerated. Needs 7-8 hours of sleep nightly. Avoid trans fats, eat small, frequent meals every 4-5 hours with lean proteins, complex carbs and healthy fats. Minimize simple carbs, GMO foods.      Relevant Medications   amphetamine-dextroamphetamine (ADDERALL) 10 MG tablet   Hypothyroidism    On Levothyroxine, continue to monitor      Relevant Medications   thyroid (ARMOUR) 120 MG tablet   Allergic rhinitis    Daily antihistamines and nasal steroids prn       Other Visit Diagnoses    Allergic state, sequela        Relevant Medications    montelukast (SINGULAIR) 10 MG tablet       I have discontinued Ms. Abaya's Vitamin D3, butalbital-acetaminophen-caffeine, ciprofloxacin, and amoxicillin-clavulanate. I am also having her start on montelukast, cefdinir, and predniSONE. Additionally, I am having her maintain her propranolol ER, temazepam, fexofenadine-pseudoephedrine, fluticasone, ibuprofen, thyroid, estradiol, progesterone, and amphetamine-dextroamphetamine.  Meds ordered this encounter  Medications  . thyroid (ARMOUR) 120 MG tablet    Sig: Take 120 mg by mouth daily.  Marland Kitchen estradiol (VIVELLE-DOT) 0.075 MG/24HR    Sig: Place 1 patch onto the skin 2 (two) times a week.  . progesterone (PROMETRIUM) 100 MG capsule    Sig: Take 100 mg by mouth daily.  Marland Kitchen amphetamine-dextroamphetamine (ADDERALL) 10 MG tablet    Sig: Take 10 mg by mouth daily with breakfast.  . montelukast (SINGULAIR) 10 MG tablet    Sig: Take 1 tablet (10 mg total) by mouth at bedtime as needed.    Dispense:  30 tablet    Refill:  5  . cefdinir (OMNICEF) 300 MG capsule    Sig: Take 1 capsule (300 mg total) by mouth 2 (two) times daily.    Dispense:  28 capsule    Refill:  0  .  predniSONE  (DELTASONE) 20 MG tablet    Sig: Take 1 tablet (20 mg total) by mouth 2 (two) times daily with a meal.    Dispense:  10 tablet    Refill:  0     Penni Homans, MD

## 2015-09-21 NOTE — Assessment & Plan Note (Signed)
On Levothyroxine, continue to monitor 

## 2015-09-21 NOTE — Assessment & Plan Note (Signed)
Started on Cefdinir. Start a probiotic, Mucinex increase rest and hydration

## 2016-01-01 ENCOUNTER — Other Ambulatory Visit (HOSPITAL_COMMUNITY): Payer: Self-pay | Admitting: Sports Medicine

## 2016-01-01 DIAGNOSIS — M5000 Cervical disc disorder with myelopathy, unspecified cervical region: Secondary | ICD-10-CM | POA: Diagnosis not present

## 2016-01-01 DIAGNOSIS — M546 Pain in thoracic spine: Secondary | ICD-10-CM | POA: Diagnosis not present

## 2016-01-01 DIAGNOSIS — M542 Cervicalgia: Secondary | ICD-10-CM

## 2016-01-01 DIAGNOSIS — S335XXA Sprain of ligaments of lumbar spine, initial encounter: Secondary | ICD-10-CM | POA: Diagnosis not present

## 2016-01-01 MED FILL — ESTRADIOL 0.075 MG PATCH: 0.075 | 28 days supply | Qty: 8 | Fill #5

## 2016-01-01 MED FILL — PROGESTERONE 100 MG CAPSULE: 100 | 30 days supply | Qty: 30 | Fill #5

## 2016-01-01 MED FILL — traMADol HCL 50 MG TABS: 50 | 10 days supply | Qty: 40 | Fill #0

## 2016-01-01 MED FILL — TEMAZEPAM 30 MG CAPSULE: 30 | 30 days supply | Qty: 30 | Fill #3

## 2016-01-03 DIAGNOSIS — M542 Cervicalgia: Secondary | ICD-10-CM | POA: Diagnosis not present

## 2016-01-03 DIAGNOSIS — M546 Pain in thoracic spine: Secondary | ICD-10-CM | POA: Diagnosis not present

## 2016-01-03 MED FILL — AMPHETAMINE SALTS 20 MG TAB: 20 | 30 days supply | Qty: 60 | Fill #0

## 2016-01-03 MED FILL — PROPRANOLOL ER 120 MG CAP: 120 | 30 days supply | Qty: 30 | Fill #2

## 2016-01-07 DIAGNOSIS — M546 Pain in thoracic spine: Secondary | ICD-10-CM | POA: Diagnosis not present

## 2016-01-07 DIAGNOSIS — M542 Cervicalgia: Secondary | ICD-10-CM | POA: Diagnosis not present

## 2016-01-08 ENCOUNTER — Ambulatory Visit (HOSPITAL_COMMUNITY)
Admission: RE | Admit: 2016-01-08 | Discharge: 2016-01-08 | Disposition: A | Discharge status: Home / Self Care | Payer: 59 | Source: Ambulatory Visit | Attending: Sports Medicine | Admitting: Sports Medicine

## 2016-01-08 DIAGNOSIS — M47892 Other spondylosis, cervical region: Secondary | ICD-10-CM | POA: Diagnosis not present

## 2016-01-08 DIAGNOSIS — M50222 Other cervical disc displacement at C5-C6 level: Secondary | ICD-10-CM | POA: Insufficient documentation

## 2016-01-08 DIAGNOSIS — M542 Cervicalgia: Secondary | ICD-10-CM | POA: Insufficient documentation

## 2016-01-08 DIAGNOSIS — R51 Headache: Secondary | ICD-10-CM | POA: Diagnosis not present

## 2016-01-09 DIAGNOSIS — M546 Pain in thoracic spine: Secondary | ICD-10-CM | POA: Diagnosis not present

## 2016-01-09 DIAGNOSIS — M542 Cervicalgia: Secondary | ICD-10-CM | POA: Diagnosis not present

## 2016-01-14 DIAGNOSIS — M542 Cervicalgia: Secondary | ICD-10-CM | POA: Diagnosis not present

## 2016-01-14 DIAGNOSIS — M546 Pain in thoracic spine: Secondary | ICD-10-CM | POA: Diagnosis not present

## 2016-01-15 DIAGNOSIS — M542 Cervicalgia: Secondary | ICD-10-CM | POA: Diagnosis not present

## 2016-01-15 DIAGNOSIS — M5412 Radiculopathy, cervical region: Secondary | ICD-10-CM | POA: Diagnosis not present

## 2016-01-15 MED FILL — SAXENDA 18 MG/3 ML PEN: 18 | 30 days supply | Qty: 15 | Fill #1

## 2016-01-15 MED FILL — diazePAM 5 MG TABS: 5 | 1 days supply | Qty: 2 | Fill #0

## 2016-01-15 MED FILL — ARMOUR THYROID 90 MG TABLET: 90 | 30 days supply | Qty: 30 | Fill #0

## 2016-01-15 MED FILL — PHENTERMINE 37.5 MG TABLET: 37.5 | 30 days supply | Qty: 30 | Fill #2

## 2016-01-17 DIAGNOSIS — M542 Cervicalgia: Secondary | ICD-10-CM | POA: Diagnosis not present

## 2016-01-17 DIAGNOSIS — M5412 Radiculopathy, cervical region: Secondary | ICD-10-CM | POA: Diagnosis not present

## 2016-01-29 MED FILL — TEMAZEPAM 30 MG CAPSULE: 30 | 30 days supply | Qty: 30 | Fill #0

## 2016-01-29 MED FILL — ESTRADIOL 0.075 MG PATCH: 0.075 | 28 days supply | Qty: 8 | Fill #6

## 2016-01-29 MED FILL — PROGESTERONE 100 MG CAPSULE: 100 | 30 days supply | Qty: 30 | Fill #6

## 2016-01-29 MED FILL — PROPRANOLOL ER 120 MG CAP: 120 | 30 days supply | Qty: 30 | Fill #3

## 2016-02-04 MED FILL — AMPHETAMINE SALTS 20 MG TAB: 20 | 30 days supply | Qty: 60 | Fill #0

## 2016-02-05 DIAGNOSIS — Z6835 Body mass index (BMI) 35.0-35.9, adult: Secondary | ICD-10-CM | POA: Diagnosis not present

## 2016-02-05 DIAGNOSIS — R634 Abnormal weight loss: Secondary | ICD-10-CM | POA: Diagnosis not present

## 2016-02-05 DIAGNOSIS — E039 Hypothyroidism, unspecified: Secondary | ICD-10-CM | POA: Diagnosis not present

## 2016-02-05 DIAGNOSIS — Z713 Dietary counseling and surveillance: Secondary | ICD-10-CM | POA: Diagnosis not present

## 2016-02-07 DIAGNOSIS — M545 Low back pain: Secondary | ICD-10-CM | POA: Diagnosis not present

## 2016-02-13 MED FILL — ARMOUR THYROID 90 MG TABLET: 90 | 30 days supply | Qty: 30 | Fill #1

## 2016-02-13 MED FILL — PHENTERMINE 37.5 MG TABLET: 37.5 | 30 days supply | Qty: 30 | Fill #3

## 2016-02-14 ENCOUNTER — Ambulatory Visit (INDEPENDENT_AMBULATORY_CARE_PROVIDER_SITE_OTHER): Payer: 59 | Admitting: Family

## 2016-02-14 ENCOUNTER — Encounter: Payer: Self-pay | Admitting: Family

## 2016-02-14 ENCOUNTER — Telehealth: Payer: Self-pay | Admitting: Family Medicine

## 2016-02-14 VITALS — BP 120/80 | HR 88 | Temp 98.3°F | Resp 18 | Ht 67.0 in | Wt 214.0 lb

## 2016-02-14 DIAGNOSIS — M79609 Pain in unspecified limb: Secondary | ICD-10-CM | POA: Diagnosis not present

## 2016-02-14 DIAGNOSIS — M542 Cervicalgia: Secondary | ICD-10-CM | POA: Diagnosis not present

## 2016-02-14 DIAGNOSIS — M545 Low back pain: Secondary | ICD-10-CM | POA: Diagnosis not present

## 2016-02-14 DIAGNOSIS — M79676 Pain in unspecified toe(s): Secondary | ICD-10-CM | POA: Insufficient documentation

## 2016-02-14 NOTE — Progress Notes (Signed)
Pre visit review using our clinic review tool, if applicable. No additional management support is needed unless otherwise documented below in the visit note. 

## 2016-02-14 NOTE — Patient Instructions (Signed)
Thank you for choosing [facility].  Summary/Instructions:  Please soak in warm epson salt. Follow up with orthropedics if symptoms worsen.  If your symptoms worsen or fail to improve, please contact our office for further instruction, or in case of emergency go directly to the emergency room at the closest medical facility.

## 2016-02-14 NOTE — Telephone Encounter (Signed)
Documented in Health Maintenance.

## 2016-02-14 NOTE — Telephone Encounter (Signed)
Patient received flu shot in September 2016 due to her being a [employer]

## 2016-02-14 NOTE — Progress Notes (Signed)
Subjective:    Patient ID: Erling Cruz, female    DOB: June 27, 1971, 45 y.o.   MRN: [id number]  Chief Complaint  Patient presents with  . Foot Pain    the left foot little toe she thinks she has glass in, she shattered a fish tank on her floor at home, she can not see the glass but can feel it    HPI:  JESSEE WOODMANSEE is a 45 y.o. 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 and presents today for an acute office visit.  This is a new problem. Associated symptom of pain located in her left fifth toe has been going on for about 3 days since she shattered a fish tank on the floor at home. Cannot see the glass and is concerned because she can feel it. Notes that she was wearing slippers at the time and then when walking bearfoot noted it. Pain is described as sharp and only able to wear flip flops currently.  No Known Allergies   Current Outpatient Prescriptions on File Prior to Visit  Medication Sig Dispense Refill  . amphetamine-dextroamphetamine (ADDERALL) 10 MG tablet Take 20 mg by mouth daily with breakfast.     . estradiol (VIVELLE-DOT) 0.075 MG/24HR Place 1 patch onto the skin 2 (two) times a week.    . fexofenadine-pseudoephedrine (ALLEGRA-D) 60-120 MG per tablet Take 1 tablet by mouth daily.     . fluticasone (FLONASE) 50 MCG/ACT nasal spray Place 2 sprays into both nostrils daily. Take two sprays in each nostril daily. 16 g 12  . ibuprofen (ADVIL,MOTRIN) 600 MG tablet Take 1 tablet (600 mg total) by mouth every 8 (eight) hours as needed. 30 tablet 0  . montelukast (SINGULAIR) 10 MG tablet Take 1 tablet (10 mg total) by mouth at bedtime as needed. 30 tablet 5  . progesterone  (PROMETRIUM) 100 MG capsule Take 100 mg by mouth daily.    . propranolol (INDERAL LA) 120 MG 24 hr capsule Take 120 mg by mouth daily.    . temazepam (RESTORIL) 30 MG capsule Take 30 mg by mouth at bedtime.     Marland Kitchen thyroid (ARMOUR) 120 MG tablet Take 90 mg by mouth daily.      No current facility-administered medications on file prior to visit.     Past Surgical History  Procedure Laterality Date  . Bilateral knee scope  1997  . Knee surgery      x 2  . Knee surgery      bilateral  . Abdominal hysterectomy  11/16/2012    Procedure: HYSTERECTOMY ABDOMINAL;  Surgeon: Cyril Mourning, MD;  Location: [facility];  Service: Gynecology;  Laterality: N/A;  . Salpingoophorectomy  11/16/2012    Procedure: SALPINGO OOPHORECTOMY;  Surgeon: Cyril Mourning, MD;  Location: [facility];  Service: Gynecology;  Laterality: Bilateral;    Review of Systems  Constitutional: Negative for fever and chills.  Musculoskeletal:       Positive for left 5th toe pain.      Objective:    BP 120/80 mmHg  Pulse 88  Temp(Src) 98.3 F (36.8 C) (Oral)  Resp 18  Ht 5\' 7"  (1.702 m)  Wt 214 lb (97.07 kg)  BMI 33.51 kg/m2  SpO2 97%  LMP 10/20/2012 Nursing note and vital signs  reviewed.  Physical Exam  Constitutional: She is oriented to person, place, and time. She appears well-developed and well-nourished. No distress.  Cardiovascular: Normal rate, regular rhythm, normal heart sounds and intact distal pulses.   Pulmonary/Chest: Effort normal and breath sounds normal.  Musculoskeletal:  Left fifth toe - no obvious deformity, discoloration, or edema. Tenderness located on the lateral and plantar aspect of the left fifth toe. No open wounds noted. No obvious class appreciated. Question small wound at the base of her fifth toe.  Neurological: She is alert and oriented to person, place, and time.  Skin: Skin is warm and dry.  Psychiatric: She has a normal mood and affect. Her behavior is normal. Judgment and thought  content normal.       Assessment & Plan:   Problem List Items Addressed This Visit      Other   Pain of fifth toe - Primary    Pain of fifth toe with no evidence of glass in her foot. There is potential of small underlying wound but question significance of it. Recommend conservative treatment with soaks in epson salt with likelihood this will heal on its own. If worsening exploration of toe may be necessary.

## 2016-02-14 NOTE — Assessment & Plan Note (Signed)
Pain of fifth toe with no evidence of glass in her foot. There is potential of small underlying wound but question significance of it. Recommend conservative treatment with soaks in epson salt with likelihood this will heal on its own. If worsening exploration of toe may be necessary.

## 2016-02-18 DIAGNOSIS — M542 Cervicalgia: Secondary | ICD-10-CM | POA: Diagnosis not present

## 2016-02-18 DIAGNOSIS — M545 Low back pain: Secondary | ICD-10-CM | POA: Diagnosis not present

## 2016-02-20 DIAGNOSIS — M545 Low back pain: Secondary | ICD-10-CM | POA: Diagnosis not present

## 2016-02-24 MED FILL — SAXENDA 18 MG/3 ML PEN: 18 | 30 days supply | Qty: 15 | Fill #2

## 2016-02-25 DIAGNOSIS — M545 Low back pain: Secondary | ICD-10-CM | POA: Diagnosis not present

## 2016-02-25 DIAGNOSIS — M542 Cervicalgia: Secondary | ICD-10-CM | POA: Diagnosis not present

## 2016-02-28 MED FILL — UNIFINE PENTIPS 32GX5/32: 32G X 4 MM | 90 days supply | Qty: 100 | Fill #0

## 2016-03-02 MED FILL — ESTRADIOL 0.075 MG PATCH: 0.075 | 28 days supply | Qty: 8 | Fill #7

## 2016-03-02 MED FILL — PROPRANOLOL ER 120 MG CAP: 120 | 30 days supply | Qty: 30 | Fill #4

## 2016-03-02 MED FILL — PROGESTERONE 100 MG CAPSULE: 100 | 30 days supply | Qty: 30 | Fill #7

## 2016-03-02 MED FILL — TEMAZEPAM 30 MG CAPSULE: 30 | 30 days supply | Qty: 30 | Fill #0

## 2016-03-03 DIAGNOSIS — M545 Low back pain: Secondary | ICD-10-CM | POA: Diagnosis not present

## 2016-03-03 DIAGNOSIS — M542 Cervicalgia: Secondary | ICD-10-CM | POA: Diagnosis not present

## 2016-03-04 MED FILL — AMPHETAMINE SALTS 30 MG TAB: 30 | 30 days supply | Qty: 60 | Fill #0

## 2016-03-10 ENCOUNTER — Encounter: Payer: 59 | Admitting: Family Medicine

## 2016-03-10 DIAGNOSIS — M542 Cervicalgia: Secondary | ICD-10-CM | POA: Diagnosis not present

## 2016-03-10 DIAGNOSIS — M545 Low back pain: Secondary | ICD-10-CM | POA: Diagnosis not present

## 2016-03-19 DIAGNOSIS — M545 Low back pain: Secondary | ICD-10-CM | POA: Diagnosis not present

## 2016-03-19 MED FILL — SAXENDA 18 MG/3 ML PEN: 18 | 30 days supply | Qty: 15 | Fill #3

## 2016-03-19 MED FILL — ARMOUR THYROID 90 MG TABLET: 90 | 30 days supply | Qty: 30 | Fill #1

## 2016-03-19 MED FILL — PHENTERMINE 37.5 MG TABLET: 37.5 | 30 days supply | Qty: 30 | Fill #0

## 2016-03-23 MED FILL — MONTELUKAST SOD 10 MG TAB: 10 | 30 days supply | Qty: 30 | Fill #2

## 2016-04-03 DIAGNOSIS — Z713 Dietary counseling and surveillance: Secondary | ICD-10-CM | POA: Diagnosis not present

## 2016-04-03 DIAGNOSIS — Z6834 Body mass index (BMI) 34.0-34.9, adult: Secondary | ICD-10-CM | POA: Diagnosis not present

## 2016-04-06 MED FILL — PROGESTERONE 100 MG CAPSULE: 100 | 30 days supply | Qty: 30 | Fill #8

## 2016-04-06 MED FILL — ESTRADIOL 0.075 MG PATCH: 0.075 | 28 days supply | Qty: 8 | Fill #8

## 2016-04-06 MED FILL — TEMAZEPAM 30 MG CAPSULE: 30 | 30 days supply | Qty: 30 | Fill #0

## 2016-04-16 ENCOUNTER — Encounter: Payer: Self-pay | Admitting: Family Medicine

## 2016-04-16 ENCOUNTER — Ambulatory Visit (INDEPENDENT_AMBULATORY_CARE_PROVIDER_SITE_OTHER): Payer: 59 | Admitting: Family Medicine

## 2016-04-16 VITALS — BP 132/90 | HR 84 | Temp 97.7°F | Ht 67.0 in | Wt 213.0 lb

## 2016-04-16 DIAGNOSIS — R21 Rash and other nonspecific skin eruption: Secondary | ICD-10-CM | POA: Diagnosis not present

## 2016-04-16 DIAGNOSIS — L299 Pruritus, unspecified: Secondary | ICD-10-CM

## 2016-04-16 MED ORDER — PREDNISONE 20 MG PO TABS: ORAL_TABLET | ORAL | Status: DC

## 2016-04-16 MED FILL — predniSONE 20 MG TABS: 20 | 10 days supply | Qty: 15 | Fill #0

## 2016-04-16 NOTE — Patient Instructions (Signed)
Use the prednisone as directed for your rash.  Let me know if you are not showing improvement in the next few days- Sooner if worse.   If you seem to get worse stop the prednisone

## 2016-04-16 NOTE — Progress Notes (Signed)
Pre visit review using our clinic tool,if applicable. No additional management support is needed unless otherwise documented below in the visit note.  

## 2016-04-16 NOTE — Progress Notes (Signed)
[place] at [facility] [address] [place], [place] [address] [phone]  Date:  04/16/2016   Name:  Daisy Ramirez   DOB:  31-Aug-1971   MRN:  [id number]  PCP:  Penni Homans, MD    Chief Complaint: Rash   History of Present Illness:  Daisy Ramirez is a 45 y.o. very pleasant female patient who presents with the following:  Her today with an itchy rash on her arms, trunk and chest for about 6 weeks.  She is not sure what caused it.  She was using a new detergent- Tide pods- but the rash did not go away when she changed backto her old soap  She takes armour thyroid- recent TSH ok per Dr. Helane Rima.   No new medications or any other new products. Her husband does not have any of this rash so we do not think it is contagious.   She otherwise feels well- no fever, chills, body aches, no hives or other rashes, no lip or tongue swelling or wheezing.  She has tried otc topical steroid cream but it is not really helping   The rash is mostly itchy but can burn sometimes as well Exposure to hot water does NOT make it worse   No history of DM  Lab Results  Component Value Date   TSH 1.687 01/11/2015     Patient Active Problem List   Diagnosis Date Noted  . Pain of fifth toe 02/14/2016  . Sinusitis 09/05/2015  . S/P total hysterectomy 09/05/2015  . Obesity 09/05/2015  . Arthritis   . Hypothyroidism 07/18/2014  . HASHIMOTO'S THYROIDITIS 04/17/2007  . Allergic rhinitis 04/13/2007  . ASTHMA 04/13/2007  . MIGRAINES, HX OF 04/13/2007    Past Medical History  Diagnosis Date  . PCOS (polycystic ovarian syndrome) 2010    followed by Dr. Candie Mile  . Hashimoto's thyroiditis   . Hypothyroidism   . Asthma     no inhaler  . Headache(784.0)   . Hypertension   . Allergy   . Migraines   . UTI (lower urinary tract infection)   . Sinusitis 09/05/2015  . S/P total hysterectomy 09/05/2015    H/o fibroids, ovarian cysts,  endometriosis and dysmenorrhea  TAH with Dr Tressia Danas, November of 2013  . Obesity 09/05/2015  . 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 Bilateral knee scope  1997  . Knee surgery      x 2  . Knee surgery      bilateral  . Abdominal hysterectomy  11/16/2012    Procedure: HYSTERECTOMY ABDOMINAL;  Surgeon: Cyril Mourning, MD;  Location: [facility];  Service: Gynecology;  Laterality: N/A;  . Salpingoophorectomy  11/16/2012    Procedure: SALPINGO OOPHORECTOMY;  Surgeon: Cyril Mourning, MD;  Location: [facility];  Service: Gynecology;  Laterality: Bilateral;    Social History  Substance Use Topics  . Smoking status: Never Smoker   . Smokeless tobacco: Never Used  . Alcohol Use: Yes     Comment: 1-2 wines per night    Family History  Problem Relation Age of Onset  . Thyroid disease Neg Hx   . Arthritis Mother   . Hypertension Mother   . Arthritis Father   .  Hyperlipidemia Father   . Alcohol abuse Paternal Aunt   . Alcohol abuse Maternal Grandmother     liver failure  . Alcohol abuse Paternal Grandmother     liver failures  . Cancer Cousin     Breast Cancer  . Heart disease Maternal Grandfather   . Heart disease Paternal Grandfather     No Known Allergies  Medication list has been reviewed and updated.  Current Outpatient Prescriptions on File Prior to Visit  Medication Sig Dispense Refill  . amphetamine-dextroamphetamine (ADDERALL) 10 MG tablet Take 20 mg by mouth daily with breakfast.     . estradiol (VIVELLE-DOT) 0.075 MG/24HR Place 1 patch onto the skin 2 (two) times a week.    . fexofenadine-pseudoephedrine (ALLEGRA-D) 60-120 MG per tablet Take 1 tablet by mouth daily.     . fluticasone (FLONASE) 50 MCG/ACT nasal spray Place 2 sprays into both nostrils daily. Take two sprays in each  nostril daily. 16 g 12  . ibuprofen (ADVIL,MOTRIN) 600 MG tablet Take 1 tablet (600 mg total) by mouth every 8 (eight) hours as needed. 30 tablet 0  . Liraglutide -Weight Management (SAXENDA [place]) Inject into the skin.    . montelukast (SINGULAIR) 10 MG tablet Take 1 tablet (10 mg total) by mouth at bedtime as needed. 30 tablet 5  . phentermine 37.5 MG capsule Take 37.5 mg by mouth every morning.    . progesterone (PROMETRIUM) 100 MG capsule Take 100 mg by mouth daily.    . propranolol (INDERAL LA) 120 MG 24 hr capsule Take 120 mg by mouth daily.    . temazepam (RESTORIL) 30 MG capsule Take 30 mg by mouth at bedtime.     Marland Kitchen thyroid (ARMOUR) 120 MG tablet Take 90 mg by mouth daily.     . traMADol (ULTRAM) 50 MG tablet Take 50 mg by mouth every 6 (six) hours as needed.     No current facility-administered medications on file prior to visit.    Review of Systems:  As per HPI- otherwise negative.   Physical Examination: Filed Vitals:   04/16/16 1458  Pulse: 84  Temp: 97.7 F (36.5 C)   Filed Vitals:   04/16/16 1458  Height: 5\' 7"  (1.702 m)  Weight: 213 lb (96.616 kg)   Body mass index is 33.35 kg/(m^2). Ideal Body Weight: Weight in (lb) to have BMI = 25: 159.3  GEN: WDWN, NAD, Non-toxic, A & O x 3, obese, looks well HEENT: Atraumatic, Normocephalic. Neck supple. No masses, No LAD.  Bilateral TM wnl, oropharynx normal.  PEERL,EOMI.   Ears and Nose: No external deformity. CV: RRR, No M/G/R. No JVD. No thrill. No extra heart sounds. PULM: CTA B, no wheezes, crackles, rhonchi. No retractions. No resp. distress. No accessory muscle use.Marland Kitchen EXTR: No c/c/e NEURO Normal gait.  PSYCH: Normally interactive. Conversant. Not depressed or anxious appearing.  Calm demeanor.  Discrete papules over her bilateral upper outer arms and a few on her chest and abdomen. Mild generalized erythema over the bilateral deltoids and outer forearms   Assessment and Plan: Rash and nonspecific skin eruption -  Plan: predniSONE (DELTASONE) 20 MG tablet  Itching - Plan: predniSONE (DELTASONE) 20 MG tablet  Here today with a non- specific itchy rash. The cause is uncertain.  A course of steroids seems likely to help with her symptoms and may perhaps clear up her rash. Will use prednisone as below and she will let me know if not resolved- Sooner if worse.   If sx  persist consider referral to derm vs punch bx  Meds ordered this encounter  Medications  . predniSONE (DELTASONE) 20 MG tablet    Sig: Take 2 daily for 5 days, then 1 daily for 5 days    Dispense:  15 tablet    Refill:  0     Signed Lamar Blinks, MD

## 2016-04-19 DIAGNOSIS — M545 Low back pain: Secondary | ICD-10-CM | POA: Diagnosis not present

## 2016-04-20 MED FILL — ARMOUR THYROID 90 MG TABLET: 90 | 30 days supply | Qty: 30 | Fill #2

## 2016-04-20 MED FILL — PHENTERMINE 37.5 MG TABLET: 37.5 | 30 days supply | Qty: 30 | Fill #0

## 2016-04-20 MED FILL — MONTELUKAST SOD 10 MG TAB: 10 | 30 days supply | Qty: 30 | Fill #3

## 2016-04-22 MED FILL — SAXENDA 18 MG/3 ML PEN: 18 | 30 days supply | Qty: 15 | Fill #0

## 2016-04-29 MED FILL — PROPRANOLOL ER 120 MG CAP: 120 | 30 days supply | Qty: 30 | Fill #5

## 2016-04-29 MED FILL — PROGESTERONE 100 MG CAPSULE: 100 | 30 days supply | Qty: 30 | Fill #9

## 2016-05-04 MED FILL — ESTRADIOL 0.075 MG PATCH: 0.075 | 28 days supply | Qty: 8 | Fill #9

## 2016-05-04 MED FILL — TEMAZEPAM 30 MG CAPSULE: 30 | 30 days supply | Qty: 30 | Fill #1

## 2016-05-04 MED FILL — AMPHETAMINE SALTS 30 MG TAB: 30 | 30 days supply | Qty: 60 | Fill #0

## 2016-05-18 MED FILL — MONTELUKAST SOD 10 MG TAB: 10 | 30 days supply | Qty: 30 | Fill #4

## 2016-05-18 MED FILL — PHENTERMINE 37.5 MG TABLET: 37.5 | 30 days supply | Qty: 30 | Fill #1

## 2016-05-18 MED FILL — ARMOUR THYROID 90 MG TABLET: 90 | 30 days supply | Qty: 30 | Fill #0

## 2016-05-19 DIAGNOSIS — M545 Low back pain: Secondary | ICD-10-CM | POA: Diagnosis not present

## 2016-05-27 DIAGNOSIS — L659 Nonscarring hair loss, unspecified: Secondary | ICD-10-CM | POA: Diagnosis not present

## 2016-05-27 DIAGNOSIS — E282 Polycystic ovarian syndrome: Secondary | ICD-10-CM | POA: Diagnosis not present

## 2016-06-03 MED FILL — AMPHETAMINE SALTS 30 MG TAB: 30 | 30 days supply | Qty: 60 | Fill #0

## 2016-06-04 MED FILL — PROPRANOLOL ER 120 MG CAP: 120 | 30 days supply | Qty: 30 | Fill #6

## 2016-06-04 MED FILL — TEMAZEPAM 30 MG CAPSULE: 30 | 30 days supply | Qty: 30 | Fill #2

## 2016-06-04 MED FILL — SAXENDA 18 MG/3 ML PEN: 18 | 30 days supply | Qty: 15 | Fill #1

## 2016-06-04 MED FILL — PROGESTERONE 100 MG CAPSULE: 100 | 30 days supply | Qty: 30 | Fill #10

## 2016-06-04 MED FILL — ESTRADIOL 0.075 MG PATCH: 0.075 | 28 days supply | Qty: 8 | Fill #10

## 2016-06-18 MED FILL — PHENTERMINE 37.5 MG TABLET: 37.5 | 30 days supply | Qty: 30 | Fill #2

## 2016-06-18 MED FILL — ARMOUR THYROID 90 MG TABLET: 90 | 30 days supply | Qty: 30 | Fill #1

## 2016-06-19 DIAGNOSIS — M545 Low back pain: Secondary | ICD-10-CM | POA: Diagnosis not present

## 2016-06-22 ENCOUNTER — Encounter: Payer: Self-pay | Admitting: Family Medicine

## 2016-06-22 ENCOUNTER — Telehealth: Payer: Self-pay | Admitting: Family Medicine

## 2016-06-22 DIAGNOSIS — R21 Rash and other nonspecific skin eruption: Secondary | ICD-10-CM

## 2016-06-22 NOTE — Telephone Encounter (Signed)
Pt says that she was seen Dr. Lorelei Pont for a rash. She says provider informed her to F/U if rash isn't better. Pt says that it isn't clearing up. Pt would like to be advised further by provider. Pt says incase provider is going to refer she would like to go to :   [facility]. -- Dr. Ethlyn [id number]

## 2016-06-24 ENCOUNTER — Encounter: Payer: Self-pay | Admitting: Family Medicine

## 2016-06-24 ENCOUNTER — Telehealth: Payer: Self-pay | Admitting: Family Medicine

## 2016-06-24 ENCOUNTER — Ambulatory Visit (INDEPENDENT_AMBULATORY_CARE_PROVIDER_SITE_OTHER): Payer: 59 | Admitting: Family Medicine

## 2016-06-24 VITALS — BP 127/86 | HR 76 | Temp 98.0°F | Ht 67.0 in | Wt 203.0 lb

## 2016-06-24 DIAGNOSIS — R232 Flushing: Secondary | ICD-10-CM | POA: Diagnosis not present

## 2016-06-24 DIAGNOSIS — R21 Rash and other nonspecific skin eruption: Secondary | ICD-10-CM

## 2016-06-24 LAB — COMPLETE METABOLIC PANEL WITH GFR
ALK PHOS: 78 U/L (ref 33–115)
ALT: 26 U/L (ref 6–29)
AST: 19 U/L (ref 10–30)
Albumin: 4.7 g/dL (ref 3.6–5.1)
BILIRUBIN TOTAL: 0.4 mg/dL (ref 0.2–1.2)
BUN: 11 mg/dL (ref 7–25)
CO2: 19 mmol/L — AB (ref 20–31)
Calcium: 9.9 mg/dL (ref 8.6–10.2)
Chloride: 103 mmol/L (ref 98–110)
Creat: 0.7 mg/dL (ref 0.50–1.10)
GLUCOSE: 74 mg/dL (ref 65–99)
POTASSIUM: 4 mmol/L (ref 3.5–5.3)
SODIUM: 140 mmol/L (ref 135–146)
TOTAL PROTEIN: 7.4 g/dL (ref 6.1–8.1)

## 2016-06-24 NOTE — Patient Instructions (Signed)
I will be in touch with your labs asap Please see if you can move your mammogram up to an earlier time Depending on your labs we will either refer you to rheumatology if evidence of autoimmune disorder, or I will try to move up your appt with Dr. Ronnald Ramp (derm)

## 2016-06-24 NOTE — Telephone Encounter (Signed)
Fine with metif

## 2016-06-24 NOTE — Telephone Encounter (Signed)
Patient would like to transfer from Dr. Charlett Blake to Dr. Lorelei Pont.please advise

## 2016-06-24 NOTE — Progress Notes (Signed)
Pre visit review using our clinic review tool, if applicable. No additional management support is needed unless otherwise documented below in the visit note. 

## 2016-06-24 NOTE — Telephone Encounter (Signed)
Fine with me

## 2016-06-24 NOTE — Progress Notes (Signed)
[place] at [facility] [address] [place], [place] [address] [phone]  Date:  06/24/2016   Name:  Daisy Ramirez   DOB:  06/29/1971   MRN:  [id number]  PCP:  Daisy Homans, MD    Chief Complaint: Follow-up   History of Present Illness:  Daisy Ramirez is a 45 y.o. very pleasant female patient who presents with the following:  She was seen here 2 months ago with a rash on her arms,trunk and chest. We used prednisone for 10 days.  This helped a little, but it never cleared up and it now is back and worse than before.  She notes that she will get itchy bumps on her chest and neck- mostly the right side- she will then scratch them until they leave small ulcers.  This is not present on any other area of her body.   She has otherwise felt well Last mammo a year ago- normal She is losing some weight but she is on phentermine Daisy Ramirez recently did labs for her- normal thyroid, CBC, normal iron, vit d a little low She has never had this in the past She had hashimoto's at age 7- wonders if she may have another autoimmune disorder now  Wt Readings from Last 3 Encounters:  06/24/16 203 lb (92.08 kg)  04/16/16 213 lb (96.616 kg)  02/14/16 214 lb (97.07 kg)     Patient Active Problem List   Diagnosis Date Noted  . Pain of fifth toe 02/14/2016  . Sinusitis 09/05/2015  . S/P total hysterectomy 09/05/2015  . Obesity 09/05/2015  . Arthritis   . Hypothyroidism 07/18/2014  . HASHIMOTO'S THYROIDITIS 04/17/2007  . Allergic rhinitis 04/13/2007  . ASTHMA 04/13/2007  . MIGRAINES, HX OF 04/13/2007    Past Medical History  Diagnosis Date  . PCOS (polycystic ovarian syndrome) 2010    followed by Daisy Ramirez  . Hashimoto's thyroiditis   . Hypothyroidism   . Asthma     no inhaler  . Headache(784.0)   . Hypertension   . Allergy   . Migraines   . UTI (lower urinary tract infection)   . Sinusitis 09/05/2015  . S/P total  hysterectomy 09/05/2015    H/o fibroids, ovarian cysts, endometriosis and dysmenorrhea  TAH with Daisy Ramirez, November of 2013  . Obesity 09/05/2015  . 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 Bilateral knee scope  1997  . Knee surgery      x 2  . Knee surgery      bilateral  . Abdominal hysterectomy  11/16/2012    Procedure: HYSTERECTOMY ABDOMINAL;  Surgeon: Daisy Mourning, MD;  Location: [facility];  Service: Gynecology;  Laterality: N/A;  . Salpingoophorectomy  11/16/2012    Procedure: SALPINGO OOPHORECTOMY;  Surgeon: Daisy Mourning, MD;  Location: [facility];  Service: Gynecology;  Laterality: Bilateral;    Social History  Substance Use Topics  . Smoking status: Never Smoker   . Smokeless tobacco: Never Used  . Alcohol Use: Yes     Comment: 1-2 wines per night    Family History  Problem Relation Age of Onset  . Thyroid disease Neg Hx   . Arthritis Mother   . Hypertension Mother   .  Arthritis Father   . Hyperlipidemia Father   . Alcohol abuse Paternal Aunt   . Alcohol abuse Maternal Grandmother     liver failure  . Alcohol abuse Paternal Grandmother     liver failures  . Cancer Cousin     Breast Cancer  . Heart disease Maternal Grandfather   . Heart disease Paternal Grandfather     No Known Allergies  Medication list has been reviewed and updated.  Current Outpatient Prescriptions on File Prior to Visit  Medication Sig Dispense Refill  . amphetamine-dextroamphetamine (ADDERALL) 10 MG tablet Take 20 mg by mouth daily with breakfast.     . Liraglutide -Weight Management (SAXENDA [place]) Inject into the skin.    . phentermine 37.5 MG capsule Take 37.5 mg by mouth every morning.    . predniSONE (DELTASONE) 20 MG tablet Take 2 daily for 5 days, then 1 daily for 5 days 15 tablet 0  .  progesterone (PROMETRIUM) 100 MG capsule Take 100 mg by mouth daily.    . temazepam (RESTORIL) 30 MG capsule Take 30 mg by mouth at bedtime.     Marland Kitchen thyroid (ARMOUR) 120 MG tablet Take 90 mg by mouth daily.      No current facility-administered medications on file prior to visit.    Review of Systems:  As per HPI- otherwise negative.   Physical Examination: Filed Vitals:   06/24/16 1539  BP: 127/86  Pulse: 76  Temp: 98 F (36.7 C)   Filed Vitals:   06/24/16 1539  Height: '5\' 7"'  (1.702 m)  Weight: 203 lb (92.08 kg)   Body mass index is 31.79 kg/(m^2). Ideal Body Weight: Weight in (lb) to have BMI = 25: 159.3   GEN: WDWN, NAD, Non-toxic, Alert & Oriented x 3, obese, looks well HEENT: Atraumatic, Normocephalic.  Ears and Nose: No external deformity. EXTR: No clubbing/cyanosis/edema NEURO: Normal gait.  PSYCH: Normally interactive. Conversant. Not depressed or anxious appearing.  Calm demeanor.  She has scattered, discrete lesions on the right breast and right chest wall. They appear to be picked nodules She also is flushed on her lateral upper arms (?sunburn but pt did not think she got burned) and redness of her bilateral cheeks and nose.    Assessment and Plan: Rash and nonspecific skin eruption - Plan: Sed Rate (ESR), C-reactive protein, ANA, Anti-Smith antibody, Anti-DNA antibody, double-stranded, COMPLETE METABOLIC PANEL WITH GFR  Facial flushing - Plan: Sed Rate (ESR), C-reactive protein, ANA, Anti-Smith antibody, Anti-DNA antibody, double-stranded, COMPLETE METABOLIC PANEL WITH GFR  Here today with a persistent rash since 01/2016.  Will check labs as above to help rule out autoimmune disorder She has an appt with Daisy. Jarome Matin ([facility]) but not till the end of August.  May need to try and move this up Recent mammo is reassuring but asked her to repeat this as it is due now  Signed Lamar Blinks, MD

## 2016-06-25 LAB — ANTI-DNA ANTIBODY, DOUBLE-STRANDED

## 2016-06-25 LAB — C-REACTIVE PROTEIN: CRP: 0.2 mg/dL — AB (ref 0.5–20.0)

## 2016-06-25 LAB — SEDIMENTATION RATE: Sed Rate: 4 mm/hr (ref 0–20)

## 2016-06-25 LAB — ANTI-SMITH ANTIBODY: ENA SM Ab Ser-aCnc: <1

## 2016-06-26 LAB — ANA: ANA: NEGATIVE

## 2016-06-26 NOTE — Addendum Note (Signed)
Addended by: Emi Holes on: 06/26/2016 10:45 AM   Modules accepted: Orders, Medications

## 2016-06-26 NOTE — Telephone Encounter (Signed)
lvm advising patient  °

## 2016-06-28 ENCOUNTER — Encounter: Payer: Self-pay | Admitting: Family Medicine

## 2016-06-29 NOTE — Telephone Encounter (Signed)
Per Dr. Lorelei Pont called [facility] to have pt's appt moved to a sooner date. The earliest appt they had is Thursday, July 13th with Dr. Harriett Sine at 3:50pm. Pt really wanted to see Dr. Ronnald Ramp but he does not have any appointments until August. Pt agrees to go to the July 13th appt with Dr. Elvera Lennox and at check out will request Dr. Ronnald Ramp for future visits.

## 2016-07-03 MED FILL — TEMAZEPAM 30 MG CAPSULE: 30 | 30 days supply | Qty: 30 | Fill #3

## 2016-07-03 MED FILL — PROPRANOLOL ER 120 MG CAP: 120 | 30 days supply | Qty: 30 | Fill #7

## 2016-07-03 MED FILL — UNIFINE PENTIPS 32GX5/32: 32G X 4 MM | 90 days supply | Qty: 100 | Fill #0

## 2016-07-03 MED FILL — AMPHETAMINE SALTS 30 MG TAB: 30 | 30 days supply | Qty: 60 | Fill #0

## 2016-07-03 MED FILL — ESTRADIOL 0.075 MG PATCH: 0.075 | 28 days supply | Qty: 8 | Fill #11

## 2016-07-03 MED FILL — SAXENDA 18 MG/3 ML PEN: 18 | 30 days supply | Qty: 15 | Fill #2

## 2016-07-03 MED FILL — PROGESTERONE 100 MG CAPSULE: 100 | 30 days supply | Qty: 30 | Fill #11

## 2016-07-09 DIAGNOSIS — L309 Dermatitis, unspecified: Secondary | ICD-10-CM | POA: Diagnosis not present

## 2016-07-09 MED FILL — TRIAMCINOLONE 0.1% CREAM: 0.1 | 20 days supply | Qty: 80 | Fill #0

## 2016-07-09 MED FILL — PERMETHRIN 5% CREAM: 5 | 1 days supply | Qty: 60 | Fill #0

## 2016-07-16 MED FILL — ARMOUR THYROID 90 MG TABLET: 90 | 30 days supply | Qty: 30 | Fill #2

## 2016-07-16 MED FILL — PHENTERMINE 37.5 MG TABLET: 37.5 | 30 days supply | Qty: 30 | Fill #0

## 2016-07-19 DIAGNOSIS — M545 Low back pain: Secondary | ICD-10-CM | POA: Diagnosis not present

## 2016-08-03 MED FILL — AMPHETAMINE SALTS 30 MG TAB: 30 | 30 days supply | Qty: 60 | Fill #0

## 2016-08-04 MED FILL — PROGESTERONE 100 MG CAPSULE: 100 | 30 days supply | Qty: 30 | Fill #12

## 2016-08-04 MED FILL — PROPRANOLOL ER 120 MG CAP: 120 | 30 days supply | Qty: 30 | Fill #8

## 2016-08-04 MED FILL — ESTRADIOL 0.075 MG PATCH: 0.075 | 28 days supply | Qty: 8 | Fill #12

## 2016-08-06 MED FILL — TEMAZEPAM 30 MG CAPSULE: 30 | 30 days supply | Qty: 30 | Fill #0

## 2016-08-07 MED FILL — SAXENDA 18 MG/3 ML PEN: 18 | 30 days supply | Qty: 15 | Fill #0

## 2016-08-11 ENCOUNTER — Encounter: Payer: 59 | Admitting: Family Medicine

## 2016-08-12 ENCOUNTER — Encounter: Payer: 59 | Admitting: Family Medicine

## 2016-08-17 MED FILL — ARMOUR THYROID 90 MG TABLET: 90 | 30 days supply | Qty: 30 | Fill #3

## 2016-08-18 MED FILL — PHENTERMINE 37.5 MG TABLET: 37.5 | 30 days supply | Qty: 30 | Fill #0

## 2016-08-19 DIAGNOSIS — M545 Low back pain: Secondary | ICD-10-CM | POA: Diagnosis not present

## 2016-08-25 DIAGNOSIS — Z01419 Encounter for gynecological examination (general) (routine) without abnormal findings: Secondary | ICD-10-CM | POA: Diagnosis not present

## 2016-08-25 DIAGNOSIS — Z6832 Body mass index (BMI) 32.0-32.9, adult: Secondary | ICD-10-CM | POA: Diagnosis not present

## 2016-08-25 DIAGNOSIS — Z1231 Encounter for screening mammogram for malignant neoplasm of breast: Secondary | ICD-10-CM | POA: Diagnosis not present

## 2016-08-25 DIAGNOSIS — Z1212 Encounter for screening for malignant neoplasm of rectum: Secondary | ICD-10-CM | POA: Diagnosis not present

## 2016-08-26 MED FILL — NP THYROID 90 MG TABLET: 90 | 30 days supply | Qty: 30 | Fill #0

## 2016-08-27 ENCOUNTER — Other Ambulatory Visit: Payer: Self-pay | Admitting: Obstetrics and Gynecology

## 2016-08-27 DIAGNOSIS — R928 Other abnormal and inconclusive findings on diagnostic imaging of breast: Secondary | ICD-10-CM

## 2016-09-02 ENCOUNTER — Ambulatory Visit
Admission: RE | Admit: 2016-09-02 | Discharge: 2016-09-02 | Disposition: A | Discharge status: Home / Self Care | Payer: 59 | Source: Ambulatory Visit | Attending: Obstetrics and Gynecology | Admitting: Obstetrics and Gynecology

## 2016-09-02 DIAGNOSIS — N6489 Other specified disorders of breast: Secondary | ICD-10-CM | POA: Diagnosis not present

## 2016-09-02 DIAGNOSIS — R928 Other abnormal and inconclusive findings on diagnostic imaging of breast: Secondary | ICD-10-CM | POA: Diagnosis not present

## 2016-09-03 MED FILL — AMPHETAMINE SALTS 30 MG TAB: 30 | 30 days supply | Qty: 60 | Fill #0

## 2016-09-03 MED FILL — TEMAZEPAM 30 MG CAPSULE: 30 | 30 days supply | Qty: 30 | Fill #0

## 2016-09-03 MED FILL — PROGESTERONE 100 MG CAPSULE: 100 | 30 days supply | Qty: 30 | Fill #0

## 2016-09-08 MED FILL — PROPRANOLOL ER 120 MG CAP: 120 | 30 days supply | Qty: 30 | Fill #9

## 2016-09-08 MED FILL — SAXENDA 18 MG/3 ML PEN: 18 | 30 days supply | Qty: 15 | Fill #0

## 2016-09-08 MED FILL — ESTRADIOL 0.075 MG PATCH: 0.075 | 28 days supply | Qty: 8 | Fill #0

## 2016-09-14 DIAGNOSIS — L858 Other specified epidermal thickening: Secondary | ICD-10-CM | POA: Diagnosis not present

## 2016-09-14 DIAGNOSIS — L821 Other seborrheic keratosis: Secondary | ICD-10-CM | POA: Diagnosis not present

## 2016-09-14 DIAGNOSIS — D225 Melanocytic nevi of trunk: Secondary | ICD-10-CM | POA: Diagnosis not present

## 2016-09-14 MED FILL — CLINDAMYCIN PHOSP 1% LOTION: 1 | 30 days supply | Qty: 60 | Fill #0

## 2016-09-17 MED FILL — PHENTERMINE 37.5 MG TABLET: 37.5 | 30 days supply | Qty: 30 | Fill #0

## 2016-09-19 DIAGNOSIS — M545 Low back pain: Secondary | ICD-10-CM | POA: Diagnosis not present

## 2016-09-24 MED FILL — NP THYROID 90 MG TABLET: 90 | 30 days supply | Qty: 30 | Fill #1

## 2016-10-05 MED FILL — PROGESTERONE 100 MG CAPSULE: 100 | 30 days supply | Qty: 30 | Fill #1

## 2016-10-05 MED FILL — AMPHETAMINE SALTS 30 MG TAB: 30 | 30 days supply | Qty: 60 | Fill #0

## 2016-10-05 MED FILL — TEMAZEPAM 30 MG CAPSULE: 30 | 30 days supply | Qty: 30 | Fill #0

## 2016-10-19 DIAGNOSIS — M545 Low back pain: Secondary | ICD-10-CM | POA: Diagnosis not present

## 2016-10-19 MED FILL — PROPRANOLOL ER 120 MG CAP: 120 | 30 days supply | Qty: 30 | Fill #0

## 2016-10-19 MED FILL — PHENTERMINE 37.5 MG TABLET: 37.5 | 30 days supply | Qty: 30 | Fill #1

## 2016-10-28 MED FILL — NP THYROID 90 MG TABLET: 90 | 30 days supply | Qty: 30 | Fill #2

## 2016-10-28 MED FILL — UNIFINE PENTIPS 32GX5/32": 32G X 4 MM | 90 days supply | Qty: 100 | Fill #0

## 2016-10-28 MED FILL — SAXENDA 18 MG/3 ML PEN: 18 | 30 days supply | Qty: 15 | Fill #1

## 2016-10-28 MED FILL — UNIFINE PENTIPS 32GX5/32: 32G X 4 MM | 90 days supply | Qty: 100 | Fill #0

## 2016-10-28 MED FILL — ESTRADIOL 0.075 MG PATCH: 0.075 | 28 days supply | Qty: 8 | Fill #1

## 2016-11-02 ENCOUNTER — Encounter: Payer: Self-pay | Admitting: Family Medicine

## 2016-11-05 MED FILL — TEMAZEPAM 30 MG CAPSULE: 30 | 30 days supply | Qty: 30 | Fill #0

## 2016-11-05 MED FILL — PROGESTERONE 100 MG CAPSULE: 100 | 30 days supply | Qty: 30 | Fill #2

## 2016-11-05 MED FILL — AMPHETAMINE SALTS 30 MG TAB: 30 | 30 days supply | Qty: 60 | Fill #0

## 2016-11-10 ENCOUNTER — Ambulatory Visit (INDEPENDENT_AMBULATORY_CARE_PROVIDER_SITE_OTHER): Payer: 59 | Admitting: Medical

## 2016-11-10 ENCOUNTER — Encounter: Payer: Self-pay | Admitting: Medical

## 2016-11-10 VITALS — BP 120/82 | HR 113 | Temp 98.3°F | Ht 67.0 in | Wt 210.0 lb

## 2016-11-10 DIAGNOSIS — J0100 Acute maxillary sinusitis, unspecified: Secondary | ICD-10-CM | POA: Diagnosis not present

## 2016-11-10 DIAGNOSIS — J209 Acute bronchitis, unspecified: Secondary | ICD-10-CM | POA: Diagnosis not present

## 2016-11-10 DIAGNOSIS — J4520 Mild intermittent asthma, uncomplicated: Secondary | ICD-10-CM | POA: Diagnosis not present

## 2016-11-10 MED ORDER — DOXYCYCLINE HYCLATE 100 MG PO TABS: 100.0000 mg | ORAL_TABLET | Freq: Two times a day (BID) | ORAL | 0 refills | Status: DC

## 2016-11-10 MED ORDER — BECLOMETHASONE DIPROPIONATE 40 MCG/ACT IN AERS: 2.0000 | INHALATION_SPRAY | Freq: Two times a day (BID) | RESPIRATORY_TRACT | Status: DC

## 2016-11-10 MED ORDER — ALBUTEROL SULFATE HFA 108 (90 BASE) MCG/ACT IN AERS: 2.0000 | INHALATION_SPRAY | Freq: Four times a day (QID) | RESPIRATORY_TRACT | 0 refills | Status: DC | PRN

## 2016-11-10 MED ORDER — FLUTICASONE PROPIONATE 50 MCG/ACT NA SUSP: 2.0000 | Freq: Every day | NASAL | 1 refills | Status: DC

## 2016-11-10 MED ORDER — BENZONATATE 100 MG PO CAPS: 100.0000 mg | ORAL_CAPSULE | Freq: Three times a day (TID) | ORAL | 0 refills | Status: DC | PRN

## 2016-11-10 MED ORDER — PREDNISONE 10 MG PO TABS: ORAL_TABLET | ORAL | 0 refills | Status: DC

## 2016-11-10 MED ORDER — METHYLPREDNISOLONE ACETATE 40 MG/ML IJ SUSP
40.0000 mg | Freq: Once | INTRAMUSCULAR | Status: AC
  Administered 2016-11-10: 40 mg via INTRAMUSCULAR

## 2016-11-10 MED FILL — BENZONATATE 100 MG CAPSULE: 100 | 7 days supply | Qty: 21 | Fill #0

## 2016-11-10 MED FILL — FLUTICASONE PROP 50 MCG SPR: 50 | 30 days supply | Qty: 16 | Fill #0

## 2016-11-10 MED FILL — VENTOLIN HFA 90 MCG INHALER: 108 (90 BAS | 30 days supply | Qty: 18 | Fill #0

## 2016-11-10 MED FILL — predniSONE 10 MG TABS: 10 | 5 days supply | Qty: 15 | Fill #0

## 2016-11-10 MED FILL — DOXYCYCLINE HYCLATE 100 MG: 100 | 10 days supply | Qty: 20 | Fill #0

## 2016-11-10 NOTE — Patient Instructions (Addendum)
You appear to have a sinus infection. I am prescribing doxycycline  antibiotic for the infection. To help with the nasal congestion I prescribed flonase nasal steroid. For your associated cough, I prescribed cough medicine benzonatate.  You have some rebound nasal congestion. Stop afrin. Start flonase. Try to be patient since recovery is process. But please don't use afrin.  For wheezing rx qvar and albuterol. If feeling sob, wheezing or tight despite inhalers start prednisone.  If worsening respiratory symptoms despite treatment then cxr. If severe breathing symptoms then ED evaluation.   Rest, hydrate, tylenol for fever.  Follow up in 7 days or as needed.

## 2016-11-10 NOTE — Progress Notes (Signed)
Subjective:    Patient ID: Daisy Ramirez, female    DOB: Feb 09, 1971, 45 y.o.   MRN: [id number]  HPI  Pt in states with left ear on Friday and last day some rt ear pain. Pt has some recent nasal congestion and sinus pressure for 2 days. She has history of sinus infection that occur very easily. Sneezing runny nose as well. Pt has been using sinex nasal spray. She has been using this for about 2 months.   Pt is not diabetic.  Pt has history of asthma. Years since used albuterol. If she gets sick or exposed to dust will wheeze. Feels constricted as if can't take full deep breath. She feels as if gettting early asthma flare.  lmp- hysterectomy in 2014.   Review of Systems  Constitutional: Negative for chills, fatigue and fever.  HENT: Positive for congestion, postnasal drip, rhinorrhea, sinus pain, sinus pressure and sneezing. Negative for trouble swallowing.   Respiratory: Positive for cough and shortness of breath. Negative for wheezing.   Cardiovascular: Negative for chest pain and palpitations.       Pt clarifies not chest pain.   Gastrointestinal: Negative for abdominal pain.  Musculoskeletal: Negative for back pain.  Skin: Negative for rash.  Hematological: Negative for adenopathy. Does not bruise/bleed easily.  Psychiatric/Behavioral: Negative for behavioral problems.    Past Medical History:  Diagnosis Date  . Allergy   . 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 Asthma    no inhaler  . Hashimoto's thyroiditis   . Headache(784.0)   . Hypertension   . Hypothyroidism   . Migraines   . Obesity 09/05/2015  . PCOS (polycystic ovarian syndrome) 2010   followed by Dr. Candie Mile  . S/P total hysterectomy 09/05/2015   H/o fibroids, ovarian cysts, endometriosis and dysmenorrhea  TAH with Dr Tressia Danas, November of 2013  . 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 Sinusitis 09/05/2015  .  UTI (lower urinary tract infection)      Social History   Social History  . Marital status: Married    Spouse name: N/A  . Number of children: 0  . Years of education: 12+   Occupational History  . Advertising account executive   Social History Main Topics  . Smoking status: Never Smoker  . Smokeless tobacco: Never Used  . Alcohol use Yes     Comment: 1-2 wines per night  . Drug use: No  . Sexual activity: Yes     Comment: lives  with husband, works in Press photographer at [employer], no dietary restrictions, minimizes dairy due to lactose intolerance   Other Topics Concern  . Not on file   Social History Narrative   Patient lives at home [place].    Patient have no children.    Patient has a Best boy.    Patient is left handed.           Past Surgical History:  Procedure Laterality Date  . ABDOMINAL HYSTERECTOMY  11/16/2012   Procedure: HYSTERECTOMY ABDOMINAL;  Surgeon: Cyril Mourning, MD;  Location: [facility];  Service: Gynecology;  Laterality: N/A;  . bilateral knee scope  1997  . KNEE SURGERY     x 2  . KNEE SURGERY     bilateral  . SALPINGOOPHORECTOMY  11/16/2012   Procedure: SALPINGO OOPHORECTOMY;  Surgeon: Cyril Mourning, MD;  Location: [facility];  Service: Gynecology;  Laterality: Bilateral;    Family History  Problem Relation Age of Onset  . Thyroid disease Neg Hx   . Arthritis Mother   . Hypertension Mother   . Arthritis Father   . Hyperlipidemia Father   . Alcohol abuse Paternal Aunt   . Alcohol abuse Maternal Grandmother     liver failure  . Alcohol abuse Paternal Grandmother     liver failures  . Cancer Cousin     Breast Cancer  . Heart disease Maternal Grandfather   . Heart disease Paternal Grandfather     No Known Allergies  Current Outpatient Prescriptions on File Prior to Visit  Medication Sig Dispense Refill  . amphetamine-dextroamphetamine (ADDERALL) 30 MG tablet Take 60 mg by mouth daily.  0  . estradiol (VIVELLE-DOT) 0.075  MG/24HR Place 1 patch onto the skin 2 (two) times a week.   12  . Liraglutide -Weight Management (SAXENDA Roslyn) Inject 3 mg into the skin daily.     . phentermine 37.5 MG capsule Take 37.5 mg by mouth every morning.    . progesterone (PROMETRIUM) 100 MG capsule Take 100 mg by mouth daily.    . propranolol ER (INDERAL LA) 120 MG 24 hr capsule Take 1 capsule by mouth daily.  12  . temazepam (RESTORIL) 30 MG capsule Take 30 mg by mouth at bedtime.      No current facility-administered medications on file prior to visit.     BP 120/82 (BP Location: Left Arm, Patient Position: Sitting, Cuff Size: Normal)   Pulse (!) 113   Temp 98.3 F (36.8 C) (Oral)   Ht 5\' 7"  (1.702 m)   Wt 210 lb (95.3 kg)   LMP 10/18/2012   SpO2 98%   BMI 32.89 kg/m       Objective:   Physical Exam  General  Mental Status - Alert. General Appearance - Well groomed. Not in acute distress.  Skin Rashes- No Rashes.  HEENT Head- Normal. Ear Auditory Canal - Left- Normal. Right - Normal.Tympanic Membrane- Left- Normal. Right- Normal. Eye Sclera/Conjunctiva- Left- Normal. Right- Normal. Nose & Sinuses Nasal Mucosa- Left-  Boggy and Congested. Right-  Boggy and  Congested.Bilateral maxillary and frontal sinus pressure. Mouth & Throat Lips: Upper Lip- Normal: no dryness, cracking, pallor, cyanosis, or vesicular eruption. Lower Lip-Normal: no dryness, cracking, pallor, cyanosis or vesicular eruption. Buccal Mucosa- Bilateral- No Aphthous ulcers. Oropharynx- No Discharge or Erythema. Tonsils: Characteristics- Bilateral- No Erythema or Congestion. Size/Enlargement- Bilateral- No enlargement. Discharge- bilateral-None.  Neck Neck- Supple. No Masses.   Chest and Lung Exam Auscultation: Breath Sounds:-even and unlabored. But shallow.  Cardiovascular Auscultation:Rythm- Regular, rate and rhythm. Murmurs & Other Heart Sounds:Ausculatation of the heart reveal- No Murmurs.  Lymphatic Head & Neck General Head &  Neck Lymphatics: Bilateral: Description- No Localized lymphadenopathy.  Lower ext- no pitting edema. Symmetric calves. Negative homans sign.      Assessment & Plan:  You appear to have a sinus infection. I am prescribing doxycycline  antibiotic for the infection. To help with the nasal congestion I prescribed flonase nasal steroid. For your associated cough, I prescribed cough medicine benzonatate.  You have some rebound nasal congestion. Stop afrin. Start flonase. Try to be patient since recovery is process. But please don't use afrin.(pt given depomedrol 40 mg im to help  with her symptoms in light of 2 month use of afrin)  For wheezing rx qvar and albuterol. If feeling sob, wheezing or tight despite inhalers start prednisone.  If worsening respiratory symptoms despite treatment then cxr. If severe breathing symptoms then ED evaluation.   Rest, hydrate, tylenol for fever.  Follow up in 7 days or as needed.

## 2016-11-17 MED FILL — PHENTERMINE 37.5 MG TABLET: 37.5 | 30 days supply | Qty: 30 | Fill #2

## 2016-11-19 DIAGNOSIS — M545 Low back pain: Secondary | ICD-10-CM | POA: Diagnosis not present

## 2016-12-02 MED FILL — ESTRADIOL 0.075 MG PATCH: 0.075 | 28 days supply | Qty: 8 | Fill #2

## 2016-12-02 MED FILL — NP THYROID 90 MG TABLET: 90 | 30 days supply | Qty: 30 | Fill #3

## 2016-12-02 MED FILL — SAXENDA 18 MG/3 ML PEN: 18 | 30 days supply | Qty: 15 | Fill #2

## 2016-12-09 MED FILL — PROGESTERONE 100 MG CAPSULE: 100 | 30 days supply | Qty: 30 | Fill #3

## 2016-12-09 MED FILL — AMPHETAMINE SALTS 30 MG TAB: 30 | 30 days supply | Qty: 60 | Fill #0

## 2016-12-15 MED FILL — TEMAZEPAM 30 MG CAPSULE: 30 | 30 days supply | Qty: 30 | Fill #0

## 2016-12-19 DIAGNOSIS — M545 Low back pain: Secondary | ICD-10-CM | POA: Diagnosis not present

## 2016-12-24 MED FILL — PHENTERMINE 37.5 MG TABLET: 37.5 | 30 days supply | Qty: 30 | Fill #3

## 2017-01-01 MED FILL — ESTRADIOL 0.075 MG PATCH: 0.075 | 28 days supply | Qty: 8 | Fill #3

## 2017-01-01 MED FILL — NP THYROID 90 MG TABLET: 90 | 30 days supply | Qty: 30 | Fill #4

## 2017-01-11 MED FILL — AMPHETAMINE SALTS 30 MG TAB: 30 | 30 days supply | Qty: 60 | Fill #0

## 2017-01-19 MED FILL — TEMAZEPAM 30 MG CAPSULE: 30 | 30 days supply | Qty: 30 | Fill #0

## 2017-01-19 MED FILL — PROGESTERONE 100 MG CAPSULE: 100 | 30 days supply | Qty: 30 | Fill #4

## 2017-01-19 MED FILL — PROPRANOLOL ER 120 MG CAP: 120 | 30 days supply | Qty: 30 | Fill #0

## 2017-01-19 MED FILL — PHENTERMINE 37.5 MG TABLET: 37.5 | 30 days supply | Qty: 30 | Fill #4

## 2017-01-30 ENCOUNTER — Encounter: Payer: Self-pay | Admitting: Family Medicine

## 2017-01-30 DIAGNOSIS — Z20828 Contact with and (suspected) exposure to other viral communicable diseases: Secondary | ICD-10-CM

## 2017-02-01 MED ORDER — OSELTAMIVIR PHOSPHATE 75 MG PO CAPS: 75.0000 mg | ORAL_CAPSULE | Freq: Every day | ORAL | 0 refills | Status: DC

## 2017-02-01 MED FILL — NP THYROID 90 MG TABLET: 90 | 30 days supply | Qty: 30 | Fill #5

## 2017-02-01 MED FILL — OSELTAMIVIR PHOS 75 MG CAP: 75 | 10 days supply | Qty: 10 | Fill #0

## 2017-02-10 MED FILL — AMPHETAMINE SALTS 30 MG TAB: 30 | 30 days supply | Qty: 60 | Fill #0

## 2017-02-19 MED FILL — TEMAZEPAM 30 MG CAPSULE: 30 | 30 days supply | Qty: 30 | Fill #0

## 2017-02-22 MED FILL — PHENTERMINE 37.5 MG TABLET: 37.5 | 30 days supply | Qty: 30 | Fill #5

## 2017-03-02 MED FILL — NP THYROID 90 MG TABLET: 90 | 30 days supply | Qty: 30 | Fill #6

## 2017-03-03 MED FILL — PROPRANOLOL ER 120 MG CAP: 120 | 30 days supply | Qty: 30 | Fill #0

## 2017-03-10 ENCOUNTER — Other Ambulatory Visit: Payer: Self-pay | Admitting: Obstetrics and Gynecology

## 2017-03-10 DIAGNOSIS — N6489 Other specified disorders of breast: Secondary | ICD-10-CM

## 2017-03-11 MED FILL — AMPHETAMINE SALTS 30 MG TAB: 30 | 30 days supply | Qty: 60 | Fill #0

## 2017-03-16 ENCOUNTER — Ambulatory Visit
Admission: RE | Admit: 2017-03-16 | Discharge: 2017-03-16 | Disposition: A | Discharge status: Home / Self Care | Payer: 59 | Source: Ambulatory Visit | Attending: Obstetrics and Gynecology | Admitting: Obstetrics and Gynecology

## 2017-03-16 DIAGNOSIS — R928 Other abnormal and inconclusive findings on diagnostic imaging of breast: Secondary | ICD-10-CM | POA: Diagnosis not present

## 2017-03-16 DIAGNOSIS — N6489 Other specified disorders of breast: Secondary | ICD-10-CM

## 2017-03-22 MED FILL — TEMAZEPAM 30 MG CAPSULE: 30 | 90 days supply | Qty: 90 | Fill #0

## 2017-03-22 MED FILL — PHENTERMINE 37.5 MG TABLET: 37.5 | 30 days supply | Qty: 30 | Fill #0

## 2017-03-31 ENCOUNTER — Encounter: Payer: Self-pay | Admitting: Family Medicine

## 2017-04-02 MED FILL — NP THYROID 90 MG TABLET: 90 | 30 days supply | Qty: 30 | Fill #7

## 2017-04-02 MED FILL — PROPRANOLOL ER 120 MG CAP: 120 | 90 days supply | Qty: 90 | Fill #0

## 2017-04-14 MED FILL — AMPHETAMINE SALTS 30 MG TAB: 30 | 30 days supply | Qty: 60 | Fill #0

## 2017-04-23 MED FILL — PHENTERMINE 37.5 MG TABLET: 37.5 | 30 days supply | Qty: 30 | Fill #1

## 2017-05-03 MED FILL — NP THYROID 90 MG TABLET: 90 | 30 days supply | Qty: 30 | Fill #8

## 2017-05-14 MED FILL — AMPHETAMINE SALTS 30 MG TAB: 30 | 30 days supply | Qty: 60 | Fill #0

## 2017-05-25 MED FILL — PHENTERMINE 37.5 MG TABLET: 37.5 | 30 days supply | Qty: 30 | Fill #2

## 2017-06-01 MED FILL — NP THYROID 90 MG TABLET: 90 | 30 days supply | Qty: 30 | Fill #9

## 2017-06-22 MED FILL — AMPHETAMINE SALTS 30 MG TAB: 30 | 30 days supply | Qty: 60 | Fill #0

## 2017-06-23 MED FILL — PHENTERMINE 37.5 MG TABLET: 37.5 | 30 days supply | Qty: 30 | Fill #3

## 2017-06-23 MED FILL — TEMAZEPAM 30 MG CAPSULE: 30 | 90 days supply | Qty: 90 | Fill #1

## 2017-06-23 MED FILL — PROPRANOLOL ER 120 MG CAP: 120 | 90 days supply | Qty: 90 | Fill #1

## 2017-07-01 MED FILL — ARMOUR THYROID 90 MG TABLET: 90 | 23 days supply | Qty: 23 | Fill #10

## 2017-07-21 MED FILL — AMPHETAMINE SALTS 30 MG TAB: 30 | 30 days supply | Qty: 60 | Fill #0

## 2017-07-21 MED FILL — PHENTERMINE 37.5 MG TABLET: 37.5 | 30 days supply | Qty: 30 | Fill #0

## 2017-07-22 MED FILL — ARMOUR THYROID 90 MG TABLET: 90 | 30 days supply | Qty: 30 | Fill #11

## 2017-08-23 MED FILL — AMPHETAMINE SALTS 30 MG TAB: 30 | 30 days supply | Qty: 60 | Fill #0

## 2017-08-23 MED FILL — ARMOUR THYROID 90 MG TABLET: 90 | 30 days supply | Qty: 30 | Fill #12

## 2017-08-24 DIAGNOSIS — M2241 Chondromalacia patellae, right knee: Secondary | ICD-10-CM | POA: Diagnosis not present

## 2017-08-24 DIAGNOSIS — S83281A Other tear of lateral meniscus, current injury, right knee, initial encounter: Secondary | ICD-10-CM | POA: Diagnosis not present

## 2017-08-24 MED FILL — MELOXICAM 15 MG TABLET: 15 | 30 days supply | Qty: 30 | Fill #0

## 2017-08-24 MED FILL — PHENTERMINE 37.5 MG TABLET: 37.5 | 30 days supply | Qty: 30 | Fill #1

## 2017-09-14 DIAGNOSIS — S83281A Other tear of lateral meniscus, current injury, right knee, initial encounter: Secondary | ICD-10-CM | POA: Diagnosis not present

## 2017-09-15 DIAGNOSIS — Z1212 Encounter for screening for malignant neoplasm of rectum: Secondary | ICD-10-CM | POA: Diagnosis not present

## 2017-09-15 DIAGNOSIS — L709 Acne, unspecified: Secondary | ICD-10-CM | POA: Diagnosis not present

## 2017-09-15 DIAGNOSIS — Z01419 Encounter for gynecological examination (general) (routine) without abnormal findings: Secondary | ICD-10-CM | POA: Diagnosis not present

## 2017-09-15 DIAGNOSIS — Z1231 Encounter for screening mammogram for malignant neoplasm of breast: Secondary | ICD-10-CM | POA: Diagnosis not present

## 2017-09-15 DIAGNOSIS — Z6833 Body mass index (BMI) 33.0-33.9, adult: Secondary | ICD-10-CM | POA: Diagnosis not present

## 2017-09-15 DIAGNOSIS — E282 Polycystic ovarian syndrome: Secondary | ICD-10-CM | POA: Diagnosis not present

## 2017-09-15 MED FILL — SPIRONOLACTONE 50 MG TAB: 50 | 90 days supply | Qty: 90 | Fill #0

## 2017-09-15 MED FILL — NORG-ETHIN ESTRA 0.25-0.035: 0.25-35 | 84 days supply | Qty: 84 | Fill #0

## 2017-09-15 MED FILL — NYSTATIN 100,000 UNITS/GM O: 100000 | 10 days supply | Qty: 15 | Fill #0

## 2017-09-20 DIAGNOSIS — M25561 Pain in right knee: Secondary | ICD-10-CM | POA: Diagnosis not present

## 2017-09-21 MED FILL — TEMAZEPAM 30 MG CAPSULE: 30 | 90 days supply | Qty: 90 | Fill #0

## 2017-09-21 MED FILL — AMPHETAMINE SALTS 30 MG TAB: 30 | 30 days supply | Qty: 60 | Fill #0

## 2017-09-21 MED FILL — PHENTERMINE 37.5 MG TABLET: 37.5 | 30 days supply | Qty: 30 | Fill #2

## 2017-09-21 MED FILL — ARMOUR THYROID 90 MG TABLET: 90 | 30 days supply | Qty: 30 | Fill #0

## 2017-09-23 DIAGNOSIS — H5213 Myopia, bilateral: Secondary | ICD-10-CM | POA: Diagnosis not present

## 2017-09-23 DIAGNOSIS — H52223 Regular astigmatism, bilateral: Secondary | ICD-10-CM | POA: Diagnosis not present

## 2017-09-24 MED FILL — PROPRANOLOL ER 120 MG CAP: 120 | 90 days supply | Qty: 90 | Fill #2
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
## 2017-10-21 MED FILL — ARMOUR THYROID 90 MG TABLET: 90 | 30 days supply | Qty: 30 | Fill #0

## 2017-10-22 MED FILL — AMPHETAMINE SALTS 30 MG TAB: 30 | 30 days supply | Qty: 60 | Fill #0

## 2017-10-22 MED FILL — PHENTERMINE 37.5 MG TABLET: 37.5 | 30 days supply | Qty: 30 | Fill #3

## 2017-10-26 DIAGNOSIS — N951 Menopausal and female climacteric states: Secondary | ICD-10-CM | POA: Diagnosis not present

## 2017-10-26 DIAGNOSIS — E282 Polycystic ovarian syndrome: Secondary | ICD-10-CM | POA: Diagnosis not present

## 2017-11-10 ENCOUNTER — Encounter (HOSPITAL_BASED_OUTPATIENT_CLINIC_OR_DEPARTMENT_OTHER): Payer: Self-pay | Admitting: *Deleted

## 2017-11-10 ENCOUNTER — Other Ambulatory Visit: Payer: Self-pay

## 2017-11-11 ENCOUNTER — Encounter (HOSPITAL_BASED_OUTPATIENT_CLINIC_OR_DEPARTMENT_OTHER)
Admission: RE | Admit: 2017-11-11 | Discharge: 2017-11-11 | Disposition: A | Discharge status: Home / Self Care | Payer: 59 | Source: Ambulatory Visit | Attending: Orthopedic Surgery | Admitting: Orthopedic Surgery

## 2017-11-11 ENCOUNTER — Encounter (HOSPITAL_BASED_OUTPATIENT_CLINIC_OR_DEPARTMENT_OTHER): Payer: Self-pay | Admitting: Physician Assistant

## 2017-11-11 DIAGNOSIS — M659 Synovitis and tenosynovitis, unspecified: Secondary | ICD-10-CM

## 2017-11-11 DIAGNOSIS — M6751 Plica syndrome, right knee: Secondary | ICD-10-CM | POA: Diagnosis not present

## 2017-11-11 DIAGNOSIS — M2241 Chondromalacia patellae, right knee: Secondary | ICD-10-CM | POA: Insufficient documentation

## 2017-11-11 DIAGNOSIS — M65961 Unspecified synovitis and tenosynovitis, right lower leg: Secondary | ICD-10-CM

## 2017-11-11 DIAGNOSIS — I10 Essential (primary) hypertension: Secondary | ICD-10-CM | POA: Insufficient documentation

## 2017-11-11 DIAGNOSIS — M25561 Pain in right knee: Secondary | ICD-10-CM

## 2017-11-11 DIAGNOSIS — G8929 Other chronic pain: Secondary | ICD-10-CM | POA: Diagnosis present

## 2017-11-11 HISTORY — DX: Synovitis and tenosynovitis, unspecified: M65.9

## 2017-11-11 HISTORY — DX: Unspecified synovitis and tenosynovitis, right lower leg: M65.961

## 2017-11-11 HISTORY — DX: Other chronic pain: G89.29

## 2017-11-11 LAB — BASIC METABOLIC PANEL
ANION GAP: 7 (ref 5–15)
BUN: 10 mg/dL (ref 6–20)
CHLORIDE: 106 mmol/L (ref 101–111)
CO2: 22 mmol/L (ref 22–32)
CREATININE: 0.78 mg/dL (ref 0.44–1.00)
Calcium: 9.5 mg/dL (ref 8.9–10.3)
GFR calc non Af Amer: >60 mL/min (ref >60)
Glucose, Bld: 143 mg/dL — ABNORMAL HIGH (ref 65–99)
POTASSIUM: 5.3 mmol/L — AB (ref 3.5–5.1)
SODIUM: 135 mmol/L (ref 135–145)

## 2017-11-11 NOTE — H&P (Signed)
Daisy Ramirez is an 46 y.o. female.   Chief Complaint: right knee pain HPI: Daisy Ramirez is a 46 year-old seen for follow up evaluation from her right knee possible lateral meniscus tear.  We injected her right knee three weeks ago which only helped temporarily.  She has sharp stabbing lateral joint line pain.  She has a remote history of bilateral Fulkerson tibial tubercle osteotomies.  We had arthroscoped her left knee in the past, which did well.  We removed the hardware from her tibial tubercle osteotomies in 1999.    Past Medical History:  Diagnosis Date  . Allergy   . 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 Asthma    no inhaler  . Hashimoto's thyroiditis   . Headache(784.0)   . Hypertension   . Hypothyroidism   . Migraines   . Obesity 09/05/2015  . PCOS (polycystic ovarian syndrome) 2010   followed by Dr. Candie Ramirez  . S/P total hysterectomy 09/05/2015   H/o fibroids, ovarian cysts, endometriosis and dysmenorrhea  TAH with Dr Daisy Ramirez, November of 2013  . 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 Sinusitis 09/05/2015  . UTI (lower urinary tract infection)     Past Surgical History:  Procedure Laterality Date  . ABDOMINAL HYSTERECTOMY  11/16/2012   Procedure: HYSTERECTOMY ABDOMINAL;  Surgeon: Daisy Ramirez;  Location: [facility];  Service: Gynecology;  Laterality: N/A;  . bilateral knee scope  1997  . KNEE SURGERY     x 2  . KNEE SURGERY     bilateral  . SALPINGOOPHORECTOMY  11/16/2012   Procedure: SALPINGO OOPHORECTOMY;  Surgeon: Daisy Ramirez;  Location: [facility];  Service: Gynecology;  Laterality: Bilateral;    Family History  Problem Relation Age of Onset  . Arthritis Mother   . Hypertension Mother   . Arthritis Father   . Hyperlipidemia Father   . Alcohol abuse Paternal Aunt   . Alcohol abuse Maternal Grandmother        liver failure  . Alcohol abuse Paternal  Grandmother        liver failures  . Heart disease Maternal Grandfather   . Heart disease Paternal Grandfather   . Cancer Cousin        Breast Cancer  . Breast cancer Cousin   . Thyroid disease Neg Hx    Social History:  reports that  has never smoked. she has never used smokeless tobacco. She reports that she drinks alcohol. She reports that she does not use drugs.  Allergies: No Known Allergies  No medications prior to admission.    Results for orders placed or performed during the hospital encounter of 11/16/17 (from the past 48 hour(s))  Basic metabolic panel     Status: Abnormal   Collection Time: 11/11/17  9:27 AM  Result Value Ref Range   Sodium 135 135 - 145 mmol/L   Potassium 5.3 (H) 3.5 - 5.1 mmol/L   Chloride 106 101 - 111 mmol/L   CO2 22 22 - 32 mmol/L   Glucose, Bld 143 (H) 65 - 99 mg/dL   BUN 10 6 - 20 mg/dL   Creatinine, Ser 0.78 0.44 - 1.00 mg/dL   Calcium 9.5 8.9 - 10.3 mg/dL   GFR calc non Af Amer >60 >60 mL/min   GFR calc Af Amer >60 >60 mL/min    Comment: (NOTE) The eGFR has been  calculated using the CKD EPI equation. This calculation has not been validated in all clinical situations. eGFR's persistently <60 mL/min signify possible Chronic Kidney Disease.    Anion gap 7 5 - 15   No results found.  Review of Systems  Constitutional: Negative.   HENT: Negative.   Eyes: Negative.   Respiratory: Negative.   Cardiovascular: Negative.   Gastrointestinal: Negative.   Genitourinary: Negative.   Musculoskeletal: Positive for joint pain.  Skin: Negative.   Neurological: Negative.   Endo/Heme/Allergies: Negative.   Psychiatric/Behavioral: Negative.     Height _0  (1.702 m), weight 95.3 kg (210 lb), last menstrual period 10/20/2012. Physical Exam  Constitutional: She is oriented to person, place, and time. She appears well-developed and well-nourished.  HENT:  Head: Normocephalic and atraumatic.  Eyes: Conjunctivae are normal. Pupils are equal,  round, and reactive to light.  Neck: Neck supple.  Cardiovascular: Normal rate.  Respiratory: Breath sounds normal.  GI: Bowel sounds are normal.  Musculoskeletal:   Examination of her right knee reveals pain on the lateral joint line.  Positive lateral McMurray's.  1+ effusion.  1+ crepitation.  Full range of motion.  Knee is stable with normal patella tracking.  Examination of the left knee reveals full range of motion without pain, swelling, weakness or instability.  Vascular exam: Pulses are 2+ and symmetric.  Neurologic exam: Distal motor and sensory examination is within normal limits.    Neurological: She is alert and oriented to person, place, and time.  Skin: Skin is warm and dry.  Psychiatric: She has a normal mood and affect.     Assessment Principal Problem:   Chronic patellofemoral pain of right knee Active Problems:   HASHIMOTO'S THYROIDITIS   Hypothyroidism   Obesity   Arthritis   Synovitis of right knee   Plan I spoke to [place] concerning her right knee MRI which revealed a significant lateral synovitis with impingement with patellofemoral chondromalacia and IT band bursitis. She is status post a remote history of bilateral Fulkerson osteotomies. I told her with these findings and her significant persistent pain I would recommend we proceed with a right knee arthroscopy with patellofemoral chondroplasty with synovectomy as well as an IT band bursa cortisone injection.  The risks and benefits of the surgery were described in detail and we will set her up for this at some point in the near future.   Linda Hedges, PA-C 11/11/2017, 4:15 PM

## 2017-11-16 ENCOUNTER — Ambulatory Visit (HOSPITAL_BASED_OUTPATIENT_CLINIC_OR_DEPARTMENT_OTHER): Payer: 59 | Admitting: Anesthesiology

## 2017-11-16 ENCOUNTER — Encounter (HOSPITAL_BASED_OUTPATIENT_CLINIC_OR_DEPARTMENT_OTHER): Payer: Self-pay | Admitting: *Deleted

## 2017-11-16 ENCOUNTER — Other Ambulatory Visit: Payer: Self-pay

## 2017-11-16 ENCOUNTER — Encounter (HOSPITAL_BASED_OUTPATIENT_CLINIC_OR_DEPARTMENT_OTHER)
Admission: RE | Disposition: A | Discharge status: Home / Self Care | Payer: Self-pay | Source: Ambulatory Visit | Attending: Orthopedic Surgery

## 2017-11-16 ENCOUNTER — Ambulatory Visit (HOSPITAL_BASED_OUTPATIENT_CLINIC_OR_DEPARTMENT_OTHER)
Admission: RE | Admit: 2017-11-16 | Discharge: 2017-11-16 | Disposition: A | Discharge status: Home / Self Care | Payer: 59 | Source: Ambulatory Visit | Attending: Orthopedic Surgery | Admitting: Orthopedic Surgery

## 2017-11-16 DIAGNOSIS — M659 Synovitis and tenosynovitis, unspecified: Secondary | ICD-10-CM | POA: Diagnosis not present

## 2017-11-16 DIAGNOSIS — M23251 Derangement of posterior horn of lateral meniscus due to old tear or injury, right knee: Secondary | ICD-10-CM | POA: Insufficient documentation

## 2017-11-16 DIAGNOSIS — Z811 Family history of alcohol abuse and dependence: Secondary | ICD-10-CM | POA: Diagnosis not present

## 2017-11-16 DIAGNOSIS — E669 Obesity, unspecified: Secondary | ICD-10-CM | POA: Diagnosis not present

## 2017-11-16 DIAGNOSIS — Z6831 Body mass index (BMI) 31.0-31.9, adult: Secondary | ICD-10-CM | POA: Insufficient documentation

## 2017-11-16 DIAGNOSIS — Z8249 Family history of ischemic heart disease and other diseases of the circulatory system: Secondary | ICD-10-CM | POA: Diagnosis not present

## 2017-11-16 DIAGNOSIS — E039 Hypothyroidism, unspecified: Secondary | ICD-10-CM | POA: Insufficient documentation

## 2017-11-16 DIAGNOSIS — J45909 Unspecified asthma, uncomplicated: Secondary | ICD-10-CM | POA: Insufficient documentation

## 2017-11-16 DIAGNOSIS — M25561 Pain in right knee: Secondary | ICD-10-CM

## 2017-11-16 DIAGNOSIS — M170 Bilateral primary osteoarthritis of knee: Secondary | ICD-10-CM | POA: Insufficient documentation

## 2017-11-16 DIAGNOSIS — Z8261 Family history of arthritis: Secondary | ICD-10-CM | POA: Diagnosis not present

## 2017-11-16 DIAGNOSIS — E282 Polycystic ovarian syndrome: Secondary | ICD-10-CM | POA: Insufficient documentation

## 2017-11-16 DIAGNOSIS — M94261 Chondromalacia, right knee: Secondary | ICD-10-CM | POA: Diagnosis not present

## 2017-11-16 DIAGNOSIS — S83281A Other tear of lateral meniscus, current injury, right knee, initial encounter: Secondary | ICD-10-CM | POA: Diagnosis not present

## 2017-11-16 DIAGNOSIS — F909 Attention-deficit hyperactivity disorder, unspecified type: Secondary | ICD-10-CM | POA: Insufficient documentation

## 2017-11-16 DIAGNOSIS — E063 Autoimmune thyroiditis: Secondary | ICD-10-CM | POA: Insufficient documentation

## 2017-11-16 DIAGNOSIS — I10 Essential (primary) hypertension: Secondary | ICD-10-CM | POA: Insufficient documentation

## 2017-11-16 DIAGNOSIS — Z90710 Acquired absence of both cervix and uterus: Secondary | ICD-10-CM | POA: Insufficient documentation

## 2017-11-16 DIAGNOSIS — M76821 Posterior tibial tendinitis, right leg: Secondary | ICD-10-CM | POA: Insufficient documentation

## 2017-11-16 DIAGNOSIS — M7651 Patellar tendinitis, right knee: Secondary | ICD-10-CM | POA: Diagnosis not present

## 2017-11-16 DIAGNOSIS — M23200 Derangement of unspecified lateral meniscus due to old tear or injury, right knee: Secondary | ICD-10-CM | POA: Diagnosis present

## 2017-11-16 DIAGNOSIS — Z803 Family history of malignant neoplasm of breast: Secondary | ICD-10-CM | POA: Insufficient documentation

## 2017-11-16 DIAGNOSIS — M1990 Unspecified osteoarthritis, unspecified site: Secondary | ICD-10-CM | POA: Diagnosis present

## 2017-11-16 DIAGNOSIS — M6751 Plica syndrome, right knee: Secondary | ICD-10-CM | POA: Diagnosis not present

## 2017-11-16 DIAGNOSIS — G8929 Other chronic pain: Secondary | ICD-10-CM | POA: Diagnosis present

## 2017-11-16 HISTORY — PX: KNEE ARTHROSCOPY WITH LATERAL MENISECTOMY: SHX6193

## 2017-11-16 HISTORY — DX: Other chronic pain: G89.29

## 2017-11-16 HISTORY — PX: CHONDROPLASTY: SHX5177

## 2017-11-16 HISTORY — DX: Synovitis and tenosynovitis, unspecified: M65.9

## 2017-11-16 HISTORY — DX: Pain in right knee: M25.561

## 2017-11-16 SURGERY — ARTHROSCOPY, KNEE, WITH LATERAL MENISCECTOMY
Anesthesia: General | Site: Knee | Laterality: Right

## 2017-11-16 MED ORDER — LACTATED RINGERS IV SOLN
INTRAVENOUS | Status: DC
  Administered 2017-11-16: 12:00:00 via INTRAVENOUS

## 2017-11-16 MED ORDER — CELECOXIB 400 MG PO CAPS
400.0000 mg | ORAL_CAPSULE | Freq: Once | ORAL | Status: AC
  Administered 2017-11-16: 400 mg via ORAL

## 2017-11-16 MED ORDER — CEFAZOLIN SODIUM-DEXTROSE 2-4 GM/100ML-% IV SOLN
INTRAVENOUS | Status: AC
  Filled 2017-11-16: qty 100

## 2017-11-16 MED ORDER — HYDROCODONE-ACETAMINOPHEN 5-325 MG PO TABS: 1.0000 | ORAL_TABLET | ORAL | 0 refills | Status: DC | PRN

## 2017-11-16 MED ORDER — CHLORHEXIDINE GLUCONATE 4 % EX LIQD: 60.0000 mL | Freq: Once | CUTANEOUS | Status: DC

## 2017-11-16 MED ORDER — PROPOFOL 10 MG/ML IV BOLUS
INTRAVENOUS | Status: DC | PRN
  Administered 2017-11-16: 200 mg via INTRAVENOUS

## 2017-11-16 MED ORDER — GABAPENTIN 300 MG PO CAPS
ORAL_CAPSULE | ORAL | Status: AC
  Filled 2017-11-16: qty 1

## 2017-11-16 MED ORDER — FENTANYL CITRATE (PF) 100 MCG/2ML IJ SOLN
INTRAMUSCULAR | Status: AC
  Filled 2017-11-16: qty 2

## 2017-11-16 MED ORDER — CEFAZOLIN SODIUM-DEXTROSE 2-4 GM/100ML-% IV SOLN
2.0000 g | INTRAVENOUS | Status: AC
  Administered 2017-11-16: 2 g via INTRAVENOUS

## 2017-11-16 MED ORDER — BUPIVACAINE-EPINEPHRINE 0.25% -1:200000 IJ SOLN
INTRAMUSCULAR | Status: DC | PRN
  Administered 2017-11-16: 20 mL
  Administered 2017-11-16: 3 mL

## 2017-11-16 MED ORDER — MIDAZOLAM HCL 2 MG/2ML IJ SOLN: 1.0000 mg | INTRAMUSCULAR | Status: DC | PRN

## 2017-11-16 MED ORDER — OXYCODONE HCL 5 MG PO TABS
5.0000 mg | ORAL_TABLET | Freq: Once | ORAL | Status: AC
  Administered 2017-11-16: 5 mg via ORAL

## 2017-11-16 MED ORDER — SODIUM CHLORIDE 0.9 % IR SOLN
Status: DC | PRN
  Administered 2017-11-16: 1500 mL

## 2017-11-16 MED ORDER — DEXAMETHASONE SODIUM PHOSPHATE 10 MG/ML IJ SOLN
INTRAMUSCULAR | Status: AC
  Filled 2017-11-16: qty 1

## 2017-11-16 MED ORDER — ACETAMINOPHEN 500 MG PO TABS
ORAL_TABLET | ORAL | Status: AC
  Filled 2017-11-16: qty 2

## 2017-11-16 MED ORDER — PROPOFOL 10 MG/ML IV BOLUS
INTRAVENOUS | Status: AC
  Filled 2017-11-16: qty 20

## 2017-11-16 MED ORDER — LACTATED RINGERS IV SOLN
INTRAVENOUS | Status: DC
  Administered 2017-11-16: 11:00:00 via INTRAVENOUS

## 2017-11-16 MED ORDER — DEXAMETHASONE SODIUM PHOSPHATE 4 MG/ML IJ SOLN
INTRAMUSCULAR | Status: DC | PRN
  Administered 2017-11-16: 10 mg via INTRAVENOUS

## 2017-11-16 MED ORDER — GABAPENTIN 300 MG PO CAPS
300.0000 mg | ORAL_CAPSULE | Freq: Once | ORAL | Status: AC
  Administered 2017-11-16: 300 mg via ORAL

## 2017-11-16 MED ORDER — METHYLPREDNISOLONE ACETATE 40 MG/ML IJ SUSP
INTRAMUSCULAR | Status: DC | PRN
  Administered 2017-11-16: 40 mg via INTRA_ARTICULAR

## 2017-11-16 MED ORDER — SCOPOLAMINE 1 MG/3DAYS TD PT72: 1.0000 | MEDICATED_PATCH | Freq: Once | TRANSDERMAL | Status: DC | PRN

## 2017-11-16 MED ORDER — ACETAMINOPHEN 500 MG PO TABS
1000.0000 mg | ORAL_TABLET | Freq: Once | ORAL | Status: AC
  Administered 2017-11-16: 1000 mg via ORAL

## 2017-11-16 MED ORDER — ONDANSETRON HCL 4 MG/2ML IJ SOLN
INTRAMUSCULAR | Status: DC | PRN
  Administered 2017-11-16: 4 mg via INTRAVENOUS

## 2017-11-16 MED ORDER — LIDOCAINE 2% (20 MG/ML) 5 ML SYRINGE
INTRAMUSCULAR | Status: AC
  Filled 2017-11-16: qty 5

## 2017-11-16 MED ORDER — APIXABAN 2.5 MG PO TABS: 2.5000 mg | ORAL_TABLET | Freq: Two times a day (BID) | ORAL | 0 refills | Status: DC

## 2017-11-16 MED ORDER — POVIDONE-IODINE 7.5 % EX SOLN: Freq: Once | CUTANEOUS | Status: DC

## 2017-11-16 MED ORDER — CELECOXIB 200 MG PO CAPS
ORAL_CAPSULE | ORAL | Status: AC
  Filled 2017-11-16: qty 2

## 2017-11-16 MED ORDER — PROMETHAZINE HCL 25 MG/ML IJ SOLN: 6.2500 mg | INTRAMUSCULAR | Status: DC | PRN

## 2017-11-16 MED ORDER — FENTANYL CITRATE (PF) 100 MCG/2ML IJ SOLN
25.0000 ug | INTRAMUSCULAR | Status: DC | PRN
  Administered 2017-11-16 (×2): 25 ug via INTRAVENOUS
  Administered 2017-11-16: 50 ug via INTRAVENOUS

## 2017-11-16 MED ORDER — ONDANSETRON HCL 4 MG/2ML IJ SOLN
INTRAMUSCULAR | Status: AC
  Filled 2017-11-16: qty 2

## 2017-11-16 MED ORDER — BUPIVACAINE-EPINEPHRINE (PF) 0.25% -1:200000 IJ SOLN
INTRAMUSCULAR | Status: AC
  Filled 2017-11-16: qty 60

## 2017-11-16 MED ORDER — GLYCOPYRROLATE 0.2 MG/ML IV SOSY
PREFILLED_SYRINGE | INTRAVENOUS | Status: DC | PRN
  Administered 2017-11-16: .2 mg via INTRAVENOUS

## 2017-11-16 MED ORDER — OXYCODONE HCL 5 MG PO TABS
ORAL_TABLET | ORAL | Status: AC
  Filled 2017-11-16: qty 1

## 2017-11-16 MED ORDER — FENTANYL CITRATE (PF) 100 MCG/2ML IJ SOLN
50.0000 ug | INTRAMUSCULAR | Status: DC | PRN
  Administered 2017-11-16: 100 ug via INTRAVENOUS

## 2017-11-16 MED ORDER — METHYLPREDNISOLONE ACETATE 40 MG/ML IJ SUSP
INTRAMUSCULAR | Status: AC
  Filled 2017-11-16: qty 1

## 2017-11-16 MED FILL — ELIQUIS 2.5 MG TABLET: 2.5 | 15 days supply | Qty: 30 | Fill #0

## 2017-11-16 MED FILL — HYDROCODON-APAP 5-325: 5-325 | 5 days supply | Qty: 30 | Fill #0

## 2017-11-16 SURGICAL SUPPLY — 61 items
APL SKNCLS STERI-STRIP NONHPOA (GAUZE/BANDAGES/DRESSINGS)
BANDAGE ACE 6X5 VEL STRL LF (GAUZE/BANDAGES/DRESSINGS) ×3 IMPLANT
BANDAGE ESMARK 6X9 LF (GAUZE/BANDAGES/DRESSINGS) IMPLANT
BENZOIN TINCTURE PRP APPL 2/3 (GAUZE/BANDAGES/DRESSINGS) IMPLANT
BLADE CUTTER GATOR 3.5 (BLADE) ×2 IMPLANT
BLADE GREAT WHITE 4.2 (BLADE) IMPLANT
BLADE GREAT WHITE 4.2MM (BLADE)
BLADE SURG 15 STRL LF DISP TIS (BLADE) IMPLANT
BLADE SURG 15 STRL SS (BLADE)
BNDG CMPR 9X6 STRL LF SNTH (GAUZE/BANDAGES/DRESSINGS)
BNDG COHESIVE 4X5 TAN STRL (GAUZE/BANDAGES/DRESSINGS) ×2 IMPLANT
BNDG ESMARK 6X9 LF (GAUZE/BANDAGES/DRESSINGS)
CLOSURE WOUND 1/2 X4 (GAUZE/BANDAGES/DRESSINGS)
DRAPE ARTHROSCOPY W/POUCH 90 (DRAPES) ×3 IMPLANT
DURAPREP 26ML APPLICATOR (WOUND CARE) ×3 IMPLANT
GAUZE SPONGE 4X4 12PLY STRL (GAUZE/BANDAGES/DRESSINGS) ×3 IMPLANT
GAUZE XEROFORM 1X8 LF (GAUZE/BANDAGES/DRESSINGS) ×3 IMPLANT
GLOVE BIO SURGEON STRL SZ7 (GLOVE) ×3 IMPLANT
GLOVE BIOGEL PI IND STRL 7.0 (GLOVE) ×1 IMPLANT
GLOVE BIOGEL PI IND STRL 7.5 (GLOVE) ×1 IMPLANT
GLOVE BIOGEL PI INDICATOR 7.0 (GLOVE) ×6
GLOVE BIOGEL PI INDICATOR 7.5 (GLOVE) ×2
GLOVE ECLIPSE 6.5 STRL STRAW (GLOVE) ×2 IMPLANT
GLOVE SS BIOGEL STRL SZ 7.5 (GLOVE) ×1 IMPLANT
GLOVE SUPERSENSE BIOGEL SZ 7.5 (GLOVE) ×2
GOWN STRL REUS W/ TWL LRG LVL3 (GOWN DISPOSABLE) ×2 IMPLANT
GOWN STRL REUS W/ TWL XL LVL3 (GOWN DISPOSABLE) ×1 IMPLANT
GOWN STRL REUS W/TWL LRG LVL3 (GOWN DISPOSABLE) ×6
GOWN STRL REUS W/TWL XL LVL3 (GOWN DISPOSABLE) ×3
HOLDER KNEE FOAM BLUE (MISCELLANEOUS) ×3 IMPLANT
IV NS IRRIG 3000ML ARTHROMATIC (IV SOLUTION) ×2 IMPLANT
K-WIRE .062X4 (WIRE) IMPLANT
KNEE WRAP E Z 3 GEL PACK (MISCELLANEOUS) ×3 IMPLANT
MANIFOLD NEPTUNE II (INSTRUMENTS) IMPLANT
NDL SAFETY ECLIPSE 18X1.5 (NEEDLE) ×2 IMPLANT
NEEDLE HYPO 18GX1.5 SHARP (NEEDLE) ×6
NEEDLE HYPO 22GX1.5 SAFETY (NEEDLE) ×4 IMPLANT
PACK ARTHROSCOPY DSU (CUSTOM PROCEDURE TRAY) ×3 IMPLANT
PACK BASIN DAY SURGERY FS (CUSTOM PROCEDURE TRAY) ×3 IMPLANT
PAD ALCOHOL SWAB (MISCELLANEOUS) ×10 IMPLANT
SET ARTHROSCOPY TUBING (MISCELLANEOUS) ×3
SET ARTHROSCOPY TUBING LN (MISCELLANEOUS) ×1 IMPLANT
STRIP CLOSURE SKIN 1/2X4 (GAUZE/BANDAGES/DRESSINGS) IMPLANT
SUCTION FRAZIER HANDLE 10FR (MISCELLANEOUS)
SUCTION TUBE FRAZIER 10FR DISP (MISCELLANEOUS) IMPLANT
SUT ETHILON 4 0 PS 2 18 (SUTURE) ×3 IMPLANT
SUT FIBERWIRE #2 38 T-5 BLUE (SUTURE)
SUT PDS AB 0 CT 36 (SUTURE) IMPLANT
SUT PROLENE 3 0 PS 2 (SUTURE) IMPLANT
SUT VIC AB 0 CT1 18XCR BRD 8 (SUTURE) IMPLANT
SUT VIC AB 0 CT1 8-18 (SUTURE)
SUT VIC AB 2-0 CT1 27 (SUTURE)
SUT VIC AB 2-0 CT1 TAPERPNT 27 (SUTURE) IMPLANT
SUT VIC AB 3-0 PS1 18 (SUTURE)
SUT VIC AB 3-0 PS1 18XBRD (SUTURE) IMPLANT
SUTURE FIBERWR #2 38 T-5 BLUE (SUTURE) IMPLANT
SYR 20CC LL (SYRINGE) ×2 IMPLANT
SYR 5ML LL (SYRINGE) ×5 IMPLANT
TOWEL OR 17X24 6PK STRL BLUE (TOWEL DISPOSABLE) ×3 IMPLANT
WAND STAR VAC 90 (SURGICAL WAND) ×2 IMPLANT
WATER STERILE IRR 1000ML POUR (IV SOLUTION) ×3 IMPLANT

## 2017-11-16 NOTE — Discharge Instructions (Signed)

## 2017-11-16 NOTE — Anesthesia Postprocedure Evaluation (Signed)
Anesthesia Post Note  Patient: Daisy Ramirez  Procedure(s) Performed: ARTHROSCOPY RIGHT KNEE WITH SYNOVECTOMY, CHONDROPLASTY, PARTIAL LATERAL MENISECTOMY (Right Knee) CHONDROPLASTY (Right Knee)     Patient location during evaluation: PACU Anesthesia Type: General Level of consciousness: awake and alert Pain management: pain level controlled Vital Signs Assessment: post-procedure vital signs reviewed and stable Respiratory status: spontaneous breathing, nonlabored ventilation and respiratory function stable Cardiovascular status: blood pressure returned to baseline and stable Postop Assessment: no apparent nausea or vomiting Anesthetic complications: no    Last Vitals:  Vitals:   11/16/17 1330 11/16/17 1400  BP: 124/81 (!) 153/80  Pulse: 78 90  Resp: 10 18  Temp:  37.1 C  SpO2: 100% 100%    Last Pain:  Vitals:   11/16/17 1400  TempSrc:   PainSc: 3                  Catalina Gravel

## 2017-11-16 NOTE — Anesthesia Procedure Notes (Signed)
Procedure Name: LMA Insertion Date/Time: 11/16/2017 12:12 PM Performed by: Lyndee Leo, CRNA Pre-anesthesia Checklist: Patient identified, Emergency Drugs available, Suction available and Patient being monitored Patient Re-evaluated:Patient Re-evaluated prior to induction Oxygen Delivery Method: Circle system utilized Preoxygenation: Pre-oxygenation with 100% oxygen Induction Type: IV induction Ventilation: Mask ventilation without difficulty LMA: LMA inserted LMA Size: 4.0 Number of attempts: 1 Airway Equipment and Method: Bite block Placement Confirmation: positive ETCO2 Tube secured with: Tape Dental Injury: Teeth and Oropharynx as per pre-operative assessment

## 2017-11-16 NOTE — Interval H&P Note (Signed)
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 After consideration of risks, benefits and other options for treatment, the patient has consented to  Procedure(s): ARTHROSCOPY RIGHT KNEE WITH SYNOVECTOMY, CHONDROPLASTY (Right) as a surgical intervention .  The patient's history has been reviewed, patient examined, no change in status, stable for surgery.  I have reviewed the patient's chart and labs.  Questions were answered to the patient's satisfaction.     Lorn Junes

## 2017-11-16 NOTE — Anesthesia Preprocedure Evaluation (Addendum)
Anesthesia Evaluation  Patient identified by MRN, date of birth, ID band Patient awake    Reviewed: Allergy & Precautions, NPO status , Patient's Chart, lab work & pertinent test results, reviewed documented beta blocker date and time   Airway Mallampati: II  TM Distance: <3 FB Neck ROM: Full    Dental  (+) Teeth Intact, Dental Advisory Given   Pulmonary asthma ,    Pulmonary exam normal breath sounds clear to auscultation       Cardiovascular hypertension, Pt. on home beta blockers and Pt. on medications Normal cardiovascular exam Rhythm:Regular Rate:Normal     Neuro/Psych  Headaches, Seizures - (x2 as child, no AEDs),     GI/Hepatic negative GI ROS, Neg liver ROS,   Endo/Other  Hypothyroidism Obesity   Renal/GU negative Renal ROS     Musculoskeletal  (+) Arthritis , Osteoarthritis,    Abdominal   Peds  (+) ADHD Hematology negative hematology ROS (+)   Anesthesia Other Findings Day of surgery medications reviewed with the patient.  Reproductive/Obstetrics negative OB ROS                           Anesthesia Physical Anesthesia Plan  ASA: II  Anesthesia Plan: General   Post-op Pain Management:    Induction: Intravenous  PONV Risk Score and Plan: 3 and Dexamethasone, Ondansetron and Midazolam  Airway Management Planned: LMA  Additional Equipment:   Intra-op Plan:   Post-operative Plan: Extubation in OR  Informed Consent: I have reviewed the patients History and Physical, chart, labs and discussed the procedure including the risks, benefits and alternatives for the proposed anesthesia with the patient or authorized representative who has indicated his/her understanding and acceptance.   Dental advisory given  Plan Discussed with: CRNA  Anesthesia Plan Comments: (Risks/benefits of general anesthesia discussed with patient including risk of damage to teeth, lips, gum, and  tongue, nausea/vomiting, allergic reactions to medications, and the possibility of heart attack, stroke and death.  All patient questions answered.  Patient wishes to proceed.)        Anesthesia Quick Evaluation

## 2017-11-16 NOTE — Transfer of Care (Signed)
Immediate Anesthesia Transfer of Care Note  Patient: Daisy Ramirez  Procedure(s) Performed: ARTHROSCOPY RIGHT KNEE WITH SYNOVECTOMY, CHONDROPLASTY, PARTIAL LATERAL MENISECTOMY (Right Knee) CHONDROPLASTY (Right Knee)  Patient Location: PACU  Anesthesia Type:General  Level of Consciousness: awake, sedated and patient cooperative  Airway & Oxygen Therapy: Patient Spontanous Breathing and Patient connected to face mask oxygen  Post-op Assessment: Report given to RN and Post -op Vital signs reviewed and stable  Post vital signs: Reviewed and stable  Last Vitals:  Vitals:   11/16/17 1036  BP: (!) 140/93  Pulse: 84  Resp: 16  Temp: 36.8 C  SpO2: 100%    Last Pain:  Vitals:   11/16/17 1036  TempSrc: Oral  PainSc: 1       Patients Stated Pain Goal: 1 (12/82/08 1388)  Complications: No apparent anesthesia complications

## 2017-11-17 ENCOUNTER — Encounter (HOSPITAL_BASED_OUTPATIENT_CLINIC_OR_DEPARTMENT_OTHER): Payer: Self-pay | Admitting: Orthopedic Surgery

## 2017-11-17 NOTE — Op Note (Signed)
NAMEKEIRSTON, SAEPHANH             ACCOUNT NO.:  [id number]  MEDICAL RECORD NO.:  [id number]  LOCATION:                                 FACILITY:  PHYSICIAN:  Adoria Kawamoto A. Noemi Chapel, M.D.      DATE OF BIRTH:  DATE OF PROCEDURE:  11/16/2017 DATE OF DISCHARGE:                              OPERATIVE REPORT   PREOPERATIVE DIAGNOSES: 1. Right knee acute nontraumatic lateral meniscus tear. 2. 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 3. 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  POSTOPERATIVE DIAGNOSES: 1. Right knee acute nontraumatic lateral meniscus tear. 2. 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 3. 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  PROCEDURES: 1. Right knee examination under anesthesia followed by arthroscopic     partial lateral meniscectomy. 2. Right knee chondroplasty with synovectomy. 3. Right knee iliotibial band bursa cortisone injection.  SURGEON:  Audree Camel. Noemi Chapel, MD.  ASSISTANT:  Kirstin Shepperson, PA-C.  ANESTHESIA:  General.  OPERATIVE TIME:  40 minutes.  COMPLICATIONS:  None.  INDICATIONS FOR PROCEDURE:  Ms. Daisy Ramirez is a 46 year old who has had significant right knee pain for the past 6 months, increasing in nature with exam and MRI documenting possible lateral meniscus tear with chondromalacia and synovitis as well as IT band bursitis.  She has failed conservative care and is now to undergo arthroscopy as well as IT band injection.  DESCRIPTION OF PROCEDURE:  Daisy Ramirez was brought to the operating room on November 16, 2017, and placed on operative table in supine position. After being placed under general anesthesia, her right knee was examined.  She had full range of motion.  Knee was stable ligamentous exam with normal patellar tracking.  The knee was sterilely injected with 0.25% Marcaine with epinephrine and the IT band bursa region over the lateral femoral condyle was  injected with 40 mg of Depo-Medrol and 3 mL of Marcaine.  The right leg was prepped using sterile DuraPrep and draped using sterile technique.  Time-out procedure was called and the correct right knee identified.  Initially, through an anterolateral portal, the arthroscope with a pump attached was placed into an anteromedial portal and arthroscopic probe was placed.  On initial inspection of the medial compartment, she had 30% grade 3 chondromalacia which was debrided.  Medial meniscus was intact.  Intercondylar notch was inspected.  Anterior and posterior cruciate ligaments were normal. Lateral compartment was inspected.  She had 25% grade 3 chondromalacia, which was debrided.  Lateral meniscus showed a small tear 10% posterior corner, which was resected back to a stable rim.  Patellofemoral joint showed only grade 1 and 2 chondromalacia.  The patella tracked normally. There was a large amount of synovitis in both medial and lateral gutters and the anterior compartment and this was thoroughly debrided and synovectomy carried out and then the small synovial bleeders were cauterized.  After this was done, it was felt that all pathology had been satisfactorily addressed.  The instruments were removed.  Portals were closed with 3-0 nylon suture.  Sterile dressings were applied.  The patient was awakened and taken to the recovery room in stable condition.  FOLLOWUP CARE:  Daisy Ramirez will be followed  as an outpatient, on Norco for pain.  I will see her back in office in a week for sutures out and followup.     Jorgina Binning A. Noemi Chapel, M.D.   ______________________________ Audree Camel. Noemi Chapel, M.D.    RAW/MEDQ  D:  11/16/2017  T:  11/16/2017  Job:  [id number]

## 2017-11-19 MED FILL — ARMOUR THYROID 90 MG TABLET: 90 | 30 days supply | Qty: 30 | Fill #1

## 2017-11-22 DIAGNOSIS — S83281D Other tear of lateral meniscus, current injury, right knee, subsequent encounter: Secondary | ICD-10-CM | POA: Diagnosis not present

## 2017-11-23 DIAGNOSIS — D239 Other benign neoplasm of skin, unspecified: Secondary | ICD-10-CM | POA: Diagnosis not present

## 2017-11-23 DIAGNOSIS — Z23 Encounter for immunization: Secondary | ICD-10-CM | POA: Diagnosis not present

## 2017-11-23 DIAGNOSIS — L309 Dermatitis, unspecified: Secondary | ICD-10-CM | POA: Diagnosis not present

## 2017-11-23 MED FILL — TRIAMCINOLONE 0.1% CREAM: 0.1 | 14 days supply | Qty: 80 | Fill #0

## 2017-11-23 MED FILL — AMPHETAMINE SALTS 30 MG TAB: 30 | 30 days supply | Qty: 60 | Fill #0

## 2017-11-23 MED FILL — SPIRONOLACTONE 100 MG TABS: 100 | 30 days supply | Qty: 30 | Fill #0

## 2017-11-23 MED FILL — MINOCYCLINE 100 MG CAPSULE: 100 | 14 days supply | Qty: 14 | Fill #0

## 2017-11-30 MED FILL — NORG-ETHIN ESTRA 0.25-0.035: 0.25-35 | 84 days supply | Qty: 84 | Fill #1

## 2017-11-30 MED FILL — PHENTERMINE 37.5 MG TABLET: 37.5 | 30 days supply | Qty: 30 | Fill #0

## 2017-12-09 DIAGNOSIS — N951 Menopausal and female climacteric states: Secondary | ICD-10-CM | POA: Diagnosis not present

## 2017-12-09 DIAGNOSIS — E282 Polycystic ovarian syndrome: Secondary | ICD-10-CM | POA: Diagnosis not present

## 2017-12-13 DIAGNOSIS — S83281D Other tear of lateral meniscus, current injury, right knee, subsequent encounter: Secondary | ICD-10-CM | POA: Diagnosis not present

## 2017-12-20 MED FILL — SPIRONOLACTONE 100 MG TABS: 100 | 90 days supply | Qty: 90 | Fill #1

## 2017-12-20 MED FILL — ARMOUR THYROID 90 MG TABLET: 90 | 30 days supply | Qty: 30 | Fill #2

## 2017-12-20 MED FILL — PROPRANOLOL ER 120 MG CAP: 120 | 90 days supply | Qty: 90 | Fill #3

## 2017-12-27 MED FILL — AMPHETAMINE SALTS 30 MG TAB: 30 | 30 days supply | Qty: 60 | Fill #0

## 2017-12-27 MED FILL — TEMAZEPAM 30 MG CAPSULE: 30 | 90 days supply | Qty: 90 | Fill #1

## 2017-12-29 MED FILL — PHENTERMINE 37.5 MG TABLET: 37.5 | 30 days supply | Qty: 30 | Fill #0

## 2018-01-27 MED FILL — ARMOUR THYROID 90 MG TABLET: 90 | 30 days supply | Qty: 30 | Fill #0

## 2018-01-27 MED FILL — PHENTERMINE 37.5 MG TABLET: 37.5 | 30 days supply | Qty: 30 | Fill #1

## 2018-01-27 MED FILL — AMPHETAMINE-DEXTROAMPHETAMI: 30 | 30 days supply | Qty: 60 | Fill #0

## 2018-03-01 MED FILL — PHENTERMINE 37.5 MG TABLET: 37.5 | 30 days supply | Qty: 30 | Fill #2

## 2018-03-01 MED FILL — ARMOUR THYROID 90 MG TABLET: 90 | 30 days supply | Qty: 30 | Fill #1

## 2018-03-01 MED FILL — AMPHETAMINE-DEXTROAMPHETAMI: 30 | 30 days supply | Qty: 60 | Fill #0

## 2018-03-03 DIAGNOSIS — N951 Menopausal and female climacteric states: Secondary | ICD-10-CM | POA: Diagnosis not present

## 2018-03-24 MED FILL — SPIRONOLACTONE 100 MG TAB: 100 | 90 days supply | Qty: 90 | Fill #2

## 2018-03-28 MED FILL — TEMAZEPAM 30 MG CAPSULE: 30 | 90 days supply | Qty: 90 | Fill #0

## 2018-03-29 MED FILL — PHENTERMINE 37.5 MG TABLET: 37.5 | 30 days supply | Qty: 30 | Fill #0

## 2018-03-29 MED FILL — LEVONEST-28 TABLET: 84 days supply | Qty: 84 | Fill #0

## 2018-03-29 MED FILL — AMPHETAMINE SALTS 30 MG TAB: 30 | 30 days supply | Qty: 60 | Fill #0

## 2018-03-29 MED FILL — PROPRANOLOL ER 120 MG CAP: 120 | 90 days supply | Qty: 90 | Fill #0

## 2018-03-29 MED FILL — ARMOUR THYROID 90 MG TABLET: 90 | 30 days supply | Qty: 30 | Fill #0

## 2018-04-28 MED FILL — ARMOUR THYROID 90 MG TABLET: 90 | 30 days supply | Qty: 30 | Fill #1

## 2018-04-28 MED FILL — PHENTERMINE 37.5 MG TABLET: 37.5 | 30 days supply | Qty: 30 | Fill #1

## 2018-04-28 MED FILL — AMPHETAMINE SALTS 30 MG TAB: 30 | 15 days supply | Qty: 30 | Fill #0

## 2018-05-27 MED FILL — AMPHETAMINE SALTS 30 MG TAB: 30 | 30 days supply | Qty: 60 | Fill #0

## 2018-05-30 MED FILL — PHENTERMINE 37.5 MG TABLET: 37.5 | 30 days supply | Qty: 30 | Fill #2

## 2018-05-30 MED FILL — ARMOUR THYROID 90 MG TABLET: 90 | 30 days supply | Qty: 30 | Fill #2

## 2018-06-07 DIAGNOSIS — E039 Hypothyroidism, unspecified: Secondary | ICD-10-CM | POA: Diagnosis not present

## 2018-06-20 MED FILL — LEVONEST-28 TABLET: 84 days supply | Qty: 84 | Fill #1

## 2018-06-24 MED FILL — TEMAZEPAM 30 MG CAPSULE: 30 | 90 days supply | Qty: 90 | Fill #1

## 2018-06-24 MED FILL — AMPHETAMINE SALTS 30 MG TAB: 30 | 30 days supply | Qty: 60 | Fill #0

## 2018-06-24 MED FILL — SPIRONOLACTONE 100 MG TABS: 100 | 90 days supply | Qty: 90 | Fill #3

## 2018-06-28 MED FILL — ARMOUR THYROID 90 MG TABLET: 90 | 30 days supply | Qty: 30 | Fill #3

## 2018-06-28 MED FILL — PHENTERMINE 37.5 MG TABLET: 37.5 | 30 days supply | Qty: 30 | Fill #3

## 2018-07-25 MED FILL — AMPHETAMINE SALTS 30 MG TAB: 30 | 30 days supply | Qty: 60 | Fill #0

## 2018-08-01 MED FILL — ARMOUR THYROID 90 MG TABLET: 90 | 30 days supply | Qty: 30 | Fill #4

## 2018-08-01 MED FILL — PHENTERMINE 37.5 MG TABLET: 37.5 | 30 days supply | Qty: 30 | Fill #0

## 2018-08-25 MED FILL — AMPHETAMINE SALTS 30 MG TAB: 30 | 30 days supply | Qty: 60 | Fill #0

## 2018-09-02 MED FILL — ARMOUR THYROID 90 MG TABLET: 90 | 30 days supply | Qty: 30 | Fill #5

## 2018-09-02 MED FILL — PROPRANOLOL ER 120 MG CAP: 120 | 90 days supply | Qty: 90 | Fill #1

## 2018-09-02 MED FILL — PHENTERMINE 37.5 MG TABLET: 37.5 | 30 days supply | Qty: 30 | Fill #1

## 2018-09-19 ENCOUNTER — Other Ambulatory Visit (HOSPITAL_COMMUNITY): Payer: Self-pay | Admitting: Obstetrics and Gynecology

## 2018-09-19 ENCOUNTER — Other Ambulatory Visit: Payer: Self-pay | Admitting: Obstetrics and Gynecology

## 2018-09-19 DIAGNOSIS — M62831 Muscle spasm of calf: Secondary | ICD-10-CM

## 2018-09-19 DIAGNOSIS — R634 Abnormal weight loss: Secondary | ICD-10-CM | POA: Diagnosis not present

## 2018-09-19 DIAGNOSIS — N951 Menopausal and female climacteric states: Secondary | ICD-10-CM | POA: Diagnosis not present

## 2018-09-19 DIAGNOSIS — Z631 Problems in relationship with in-laws: Secondary | ICD-10-CM | POA: Diagnosis not present

## 2018-09-19 DIAGNOSIS — Z1212 Encounter for screening for malignant neoplasm of rectum: Secondary | ICD-10-CM | POA: Diagnosis not present

## 2018-09-19 DIAGNOSIS — E559 Vitamin D deficiency, unspecified: Secondary | ICD-10-CM | POA: Diagnosis not present

## 2018-09-19 DIAGNOSIS — Z1231 Encounter for screening mammogram for malignant neoplasm of breast: Secondary | ICD-10-CM | POA: Diagnosis not present

## 2018-09-19 DIAGNOSIS — Z01419 Encounter for gynecological examination (general) (routine) without abnormal findings: Secondary | ICD-10-CM | POA: Diagnosis not present

## 2018-09-19 DIAGNOSIS — Z01411 Encounter for gynecological examination (general) (routine) with abnormal findings: Secondary | ICD-10-CM | POA: Diagnosis not present

## 2018-09-19 DIAGNOSIS — E282 Polycystic ovarian syndrome: Secondary | ICD-10-CM | POA: Diagnosis not present

## 2018-09-19 DIAGNOSIS — E28310 Symptomatic premature menopause: Secondary | ICD-10-CM | POA: Diagnosis not present

## 2018-09-20 ENCOUNTER — Ambulatory Visit
Admission: RE | Admit: 2018-09-20 | Discharge: 2018-09-20 | Disposition: A | Discharge status: Home / Self Care | Payer: 59 | Source: Ambulatory Visit | Attending: Obstetrics and Gynecology | Admitting: Obstetrics and Gynecology

## 2018-09-20 DIAGNOSIS — M62831 Muscle spasm of calf: Secondary | ICD-10-CM

## 2018-09-20 DIAGNOSIS — M7989 Other specified soft tissue disorders: Secondary | ICD-10-CM | POA: Diagnosis not present

## 2018-09-22 MED FILL — AMPHETAMINE SALTS 30 MG TAB: 30 | 30 days supply | Qty: 60 | Fill #0

## 2018-09-22 MED FILL — SPIRONOLACTONE 100 MG TABS: 100 | 90 days supply | Qty: 90 | Fill #4

## 2018-09-22 MED FILL — TEMAZEPAM 30 MG CAPSULE: 30 | 90 days supply | Qty: 90 | Fill #0

## 2018-09-22 MED FILL — LEVONEST-28 TABLET: 84 days supply | Qty: 84 | Fill #2

## 2018-09-30 MED FILL — PHENTERMINE 37.5 MG TABLET: 37.5 | 30 days supply | Qty: 30 | Fill #2

## 2018-09-30 MED FILL — ARMOUR THYROID 90 MG TABLET: 90 | 30 days supply | Qty: 30 | Fill #6

## 2018-10-20 DIAGNOSIS — H5213 Myopia, bilateral: Secondary | ICD-10-CM | POA: Diagnosis not present

## 2018-10-31 MED FILL — ARMOUR THYROID 90 MG TABLET: 90 | 30 days supply | Qty: 30 | Fill #7

## 2018-10-31 MED FILL — AMPHETAMINE SALTS 30 MG TAB: 30 | 30 days supply | Qty: 60 | Fill #0

## 2018-10-31 MED FILL — PHENTERMINE 37.5 MG TABLET: 37.5 | 30 days supply | Qty: 30 | Fill #3

## 2018-12-01 MED FILL — AMPHETAMINE SALTS 30 MG TAB: 30 | 30 days supply | Qty: 60 | Fill #0

## 2018-12-07 MED FILL — PHENTERMINE 37.5 MG TABLET: 37.5 | 30 days supply | Qty: 30 | Fill #0

## 2018-12-07 MED FILL — ARMOUR THYROID 90 MG TABLET: 90 | 30 days supply | Qty: 30 | Fill #8

## 2018-12-12 DIAGNOSIS — E282 Polycystic ovarian syndrome: Secondary | ICD-10-CM | POA: Diagnosis not present

## 2018-12-14 MED FILL — PROPRANOLOL ER 120 MG CAP: 120 | 90 days supply | Qty: 90 | Fill #2

## 2018-12-30 MED FILL — SPIRONOLACTONE 100 MG TAB: 100 | 90 days supply | Qty: 90 | Fill #0

## 2018-12-30 MED FILL — TEMAZEPAM 30 MG CAPSULE: 30 | 90 days supply | Qty: 90 | Fill #1

## 2019-01-04 MED FILL — LEVONEST-28 TABLET: 50-30/75-40 | 84 days supply | Qty: 84 | Fill #3

## 2019-01-04 MED FILL — AMPHETAMINE SALTS 30 MG TAB: 30 | 30 days supply | Qty: 60 | Fill #0

## 2019-01-04 MED FILL — ARMOUR THYROID 90 MG TABLET: 90 | 30 days supply | Qty: 30 | Fill #9

## 2019-01-04 MED FILL — PHENTERMINE 37.5 MG TABLET: 37.5 | 30 days supply | Qty: 30 | Fill #1

## 2019-02-03 MED FILL — AMPHETAMINE SALTS 30 MG TAB: 30 | 30 days supply | Qty: 60 | Fill #0

## 2019-02-09 MED FILL — PHENTERMINE 37.5 MG TABLET: 37.5 | 30 days supply | Qty: 30 | Fill #2

## 2019-02-09 MED FILL — ARMOUR THYROID 90 MG TABLET: 90 | 30 days supply | Qty: 30 | Fill #10

## 2019-03-07 DIAGNOSIS — N951 Menopausal and female climacteric states: Secondary | ICD-10-CM | POA: Diagnosis not present

## 2019-03-07 DIAGNOSIS — F5104 Psychophysiologic insomnia: Secondary | ICD-10-CM | POA: Diagnosis not present

## 2019-03-07 DIAGNOSIS — E282 Polycystic ovarian syndrome: Secondary | ICD-10-CM | POA: Diagnosis not present

## 2019-03-07 DIAGNOSIS — R748 Abnormal levels of other serum enzymes: Secondary | ICD-10-CM | POA: Diagnosis not present

## 2019-03-07 DIAGNOSIS — E559 Vitamin D deficiency, unspecified: Secondary | ICD-10-CM | POA: Diagnosis not present

## 2019-03-07 DIAGNOSIS — Z6831 Body mass index (BMI) 31.0-31.9, adult: Secondary | ICD-10-CM | POA: Diagnosis not present

## 2019-03-07 DIAGNOSIS — F419 Anxiety disorder, unspecified: Secondary | ICD-10-CM | POA: Diagnosis not present

## 2019-03-07 DIAGNOSIS — Z713 Dietary counseling and surveillance: Secondary | ICD-10-CM | POA: Diagnosis not present

## 2019-03-07 DIAGNOSIS — E785 Hyperlipidemia, unspecified: Secondary | ICD-10-CM | POA: Diagnosis not present

## 2019-03-07 MED FILL — PROPRANOLOL ER 120 MG CAP: 120 | 90 days supply | Qty: 90 | Fill #0

## 2019-03-07 MED FILL — AMPHETAMINE SALTS 30 MG TAB: 30 | 30 days supply | Qty: 60 | Fill #0

## 2019-03-07 MED FILL — ARMOUR THYROID 90 MG TABLET: 90 | 30 days supply | Qty: 30 | Fill #0 | Status: TO

## 2019-03-10 MED FILL — PHENTERMINE 37.5 MG TABLET: 37.5 | 30 days supply | Qty: 30 | Fill #3 | Status: TO

## 2019-03-13 MED FILL — LEVONEST-28 TABLET: 50-30/75-40 | 84 days supply | Qty: 84 | Fill #0

## 2019-03-29 MED FILL — PHENTERMINE 37.5 MG TABLET: 37.5 | 30 days supply | Qty: 30 | Fill #0

## 2019-03-29 MED FILL — TEMAZEPAM 30 MG CAPSULE: 30 | 90 days supply | Qty: 90 | Fill #0

## 2019-03-29 MED FILL — ARMOUR THYROID 90 MG TABLET: 90 | 30 days supply | Qty: 30 | Fill #0

## 2019-03-29 MED FILL — SPIRONOLACTONE 100 MG TAB: 100 | 90 days supply | Qty: 90 | Fill #0

## 2019-04-11 MED FILL — AMPHETAMINE-DEXTROAMPHETAMI: 30 | 30 days supply | Qty: 60 | Fill #0

## 2019-04-22 MED FILL — ARMOUR THYROID 90 MG TABLET: 90 | 30 days supply | Qty: 30 | Fill #1

## 2019-05-10 MED FILL — PHENTERMINE 37.5 MG TABLET: 37.5 | 30 days supply | Qty: 30 | Fill #0

## 2019-05-10 MED FILL — AMPHETAMINE-DEXTROAMPHETAMI: 30 | 30 days supply | Qty: 60 | Fill #0

## 2019-05-22 MED FILL — ARMOUR THYROID 90 MG TABLET: 90 | 30 days supply | Qty: 30 | Fill #2

## 2019-06-15 MED FILL — PROPRANOLOL HCL ER 120 MG C: 120 | 90 days supply | Qty: 90 | Fill #0

## 2019-06-20 MED FILL — PHENTERMINE 37.5 MG TABLET: 37.5 | 30 days supply | Qty: 30 | Fill #0

## 2019-06-20 MED FILL — AMPHETAMINE-DEXTROAMPHETAMI: 30 | 30 days supply | Qty: 60 | Fill #0

## 2019-06-21 MED FILL — ARMOUR THYROID 90 MG TABLET: 90 | 30 days supply | Qty: 30 | Fill #3

## 2019-06-21 MED FILL — SPIRONOLACTONE 100 MG TAB: 100 | 90 days supply | Qty: 90 | Fill #1

## 2019-06-27 MED FILL — TEMAZEPAM 30 MG CAPSULE: 30 | 90 days supply | Qty: 90 | Fill #0

## 2019-07-21 MED FILL — ARMOUR THYROID 90 MG TABLET: 90 | 30 days supply | Qty: 30 | Fill #4

## 2019-07-26 MED FILL — AMPHETAMINE-DEXTROAMPHETAMI: 30 | 30 days supply | Qty: 60 | Fill #0

## 2019-07-26 MED FILL — PHENTERMINE 37.5 MG TABLET: 37.5 | 30 days supply | Qty: 30 | Fill #0

## 2019-08-20 MED FILL — ARMOUR THYROID 90 MG TABLET: 90 | 30 days supply | Qty: 30 | Fill #5

## 2019-09-19 MED FILL — ARMOUR THYROID 90 MG TABLET: 90 | 30 days supply | Qty: 30 | Fill #6

## 2019-09-19 MED FILL — SPIRONOLACTONE 100 MG TAB: 100 | 90 days supply | Qty: 90 | Fill #2

## 2019-10-19 MED FILL — ARMOUR THYROID 90 MG TABLET: 90 | 30 days supply | Qty: 30 | Fill #7

## 2019-11-08 DIAGNOSIS — F419 Anxiety disorder, unspecified: Secondary | ICD-10-CM | POA: Diagnosis not present

## 2019-11-08 DIAGNOSIS — E8941 Symptomatic postprocedural ovarian failure: Secondary | ICD-10-CM | POA: Diagnosis not present

## 2019-11-08 DIAGNOSIS — Z01419 Encounter for gynecological examination (general) (routine) without abnormal findings: Secondary | ICD-10-CM | POA: Diagnosis not present

## 2019-11-08 DIAGNOSIS — Z6834 Body mass index (BMI) 34.0-34.9, adult: Secondary | ICD-10-CM | POA: Diagnosis not present

## 2019-11-08 DIAGNOSIS — E039 Hypothyroidism, unspecified: Secondary | ICD-10-CM | POA: Diagnosis not present

## 2019-11-08 DIAGNOSIS — Z1231 Encounter for screening mammogram for malignant neoplasm of breast: Secondary | ICD-10-CM | POA: Diagnosis not present

## 2019-11-08 DIAGNOSIS — E559 Vitamin D deficiency, unspecified: Secondary | ICD-10-CM | POA: Diagnosis not present

## 2019-11-08 DIAGNOSIS — F909 Attention-deficit hyperactivity disorder, unspecified type: Secondary | ICD-10-CM | POA: Diagnosis not present

## 2019-11-08 DIAGNOSIS — F5104 Psychophysiologic insomnia: Secondary | ICD-10-CM | POA: Diagnosis not present

## 2019-11-08 MED FILL — PHENTERMINE 37.5 MG TABLET: 37.5 | 30 days supply | Qty: 30 | Fill #0

## 2019-11-10 MED FILL — AMPHETAMINE SALTS 30 MG TAB: 30 | 30 days supply | Qty: 60 | Fill #0

## 2019-11-10 MED FILL — LEVONEST-28 TABLET: 50-30/75-40 | 84 days supply | Qty: 84 | Fill #0

## 2019-11-10 MED FILL — TEMAZEPAM 30 MG CAPSULE: 30 | 90 days supply | Qty: 90 | Fill #0

## 2019-11-10 MED FILL — PROPRANOLOL HCL ER 120 MG C: 120 | 90 days supply | Qty: 90 | Fill #0

## 2019-11-13 MED FILL — ARMOUR THYROID 90 MG TABLET: 90 | 90 days supply | Qty: 90 | Fill #0

## 2019-11-15 MED FILL — VIT D2 1.25 MG (50,000 UNIT: 1.25 MG | 70 days supply | Qty: 10 | Fill #0

## 2019-11-15 MED FILL — METFORMIN HCL ER 500 MG TB2: 500 | 30 days supply | Qty: 30 | Fill #0

## 2019-12-11 MED FILL — metFORMIN HCL ER 500 MG TB2: 500 | 30 days supply | Qty: 30 | Fill #1

## 2019-12-11 MED FILL — PHENTERMINE 37.5 MG TABLET: 37.5 | 30 days supply | Qty: 30 | Fill #0

## 2019-12-11 MED FILL — AMPHETAMINE-DEXTROAMPHETAMI: 30 | 30 days supply | Qty: 60 | Fill #0

## 2019-12-18 MED FILL — SPIRONOLACTONE 100 MG TAB: 100 | 90 days supply | Qty: 90 | Fill #0

## 2020-01-08 MED FILL — PHENTERMINE 37.5 MG TABLET: 37.5 | 30 days supply | Qty: 30 | Fill #1

## 2020-01-10 MED FILL — AMPHETAMINE-DEXTROAMPHETAMI: 30 | 30 days supply | Qty: 60 | Fill #0

## 2020-01-24 MED FILL — metFORMIN HCL ER 500 MG TB2: 500 | 30 days supply | Qty: 30 | Fill #2

## 2020-01-24 MED FILL — VIT D2 1.25 MG (50,000 UNIT: 1.25 MG | 70 days supply | Qty: 10 | Fill #1

## 2020-02-07 MED FILL — TEMAZEPAM 30 MG CAPSULE: 30 | 90 days supply | Qty: 90 | Fill #0

## 2020-02-07 MED FILL — PROPRANOLOL HCL ER 120 MG C: 120 | 90 days supply | Qty: 90 | Fill #1

## 2020-02-07 MED FILL — PHENTERMINE 37.5 MG TABLET: 37.5 | 30 days supply | Qty: 30 | Fill #2

## 2020-02-08 MED FILL — AMPHETAMINE-DEXTROAMPHETAMI: 30 | 30 days supply | Qty: 60 | Fill #0

## 2020-02-27 MED FILL — metFORMIN HCL ER 500 MG TB2: 500 | 90 days supply | Qty: 90 | Fill #3

## 2020-03-06 MED FILL — PHENTERMINE 37.5 MG TABLET: 37.5 | 30 days supply | Qty: 30 | Fill #3

## 2020-03-07 MED FILL — AMPHETAMINE-DEXTROAMPHETAMI: 30 | 30 days supply | Qty: 60 | Fill #0

## 2020-03-21 MED FILL — LEVONEST-28 TABLET: 50-30/75-40 | 84 days supply | Qty: 84 | Fill #1

## 2020-04-01 MED FILL — ARMOUR THYROID 90 MG TABLET: 90 | 90 days supply | Qty: 90 | Fill #1

## 2020-04-08 MED FILL — VIT D2 1.25 MG (50,000 UNIT: 1.25 MG | 70 days supply | Qty: 10 | Fill #2

## 2020-04-08 MED FILL — AMPHETAMINE-DEXTROAMPHETAMI: 30 | 30 days supply | Qty: 60 | Fill #0

## 2020-04-08 MED FILL — PHENTERMINE 37.5 MG TABLET: 37.5 | 30 days supply | Qty: 30 | Fill #0

## 2020-06-27 MED FILL — metFORMIN HCL ER 500 MG TB2: 500 | 90 days supply | Qty: 90 | Fill #4

## 2020-06-27 MED FILL — ARMOUR THYROID 90 MG TABLET: 90 | 90 days supply | Qty: 90 | Fill #2

## 2020-07-08 MED FILL — AMPHETAMINE-DEXTROAMPHETAMI: 30 | 30 days supply | Qty: 60 | Fill #0

## 2020-07-08 MED FILL — VIT D2 1.25 MG (50,000 UNIT: 1.25 MG | 70 days supply | Qty: 10 | Fill #3

## 2020-07-08 MED FILL — PHENTERMINE 37.5 MG TABLET: 37.5 | 30 days supply | Qty: 30 | Fill #3

## 2020-08-06 MED FILL — PROPRANOLOL HCL ER 120 MG C: 120 | 90 days supply | Qty: 90 | Fill #3

## 2020-08-06 MED FILL — PHENTERMINE 37.5 MG TABLET: 37.5 | 30 days supply | Qty: 30 | Fill #0

## 2020-08-06 MED FILL — TEMAZEPAM 30 MG CAPSULE: 30 | 90 days supply | Qty: 90 | Fill #1

## 2020-08-06 MED FILL — AMPHETAMINE-DEXTROAMPHETAMI: 30 | 30 days supply | Qty: 60 | Fill #0

## 2020-09-09 MED FILL — PHENTERMINE 37.5 MG TABLET: 37.5 | 30 days supply | Qty: 30 | Fill #1

## 2020-09-09 MED FILL — LEVONEST-28 TABLET: 50-30/75-40 | 84 days supply | Qty: 84 | Fill #3

## 2020-09-09 MED FILL — SPIRONOLACTONE 100 MG TAB: 100 | 90 days supply | Qty: 90 | Fill #2

## 2020-09-10 MED FILL — AMPHETAMINE-DEXTROAMPHETAMI: 30 | 30 days supply | Qty: 60 | Fill #0

## 2020-09-27 MED FILL — ARMOUR THYROID 90 MG TABLET: 90 | 90 days supply | Qty: 90 | Fill #3

## 2020-10-09 MED FILL — PHENTERMINE 37.5 MG TABLET: 37.5 | 30 days supply | Qty: 30 | Fill #2

## 2020-10-10 ENCOUNTER — Other Ambulatory Visit (HOSPITAL_COMMUNITY): Payer: Self-pay | Admitting: Obstetrics and Gynecology

## 2020-10-10 MED FILL — AMPHETAMINE-DEXTROAMPHETAMI: 30 | 30 days supply | Qty: 60 | Fill #0

## 2020-11-08 MED FILL — PHENTERMINE 37.5 MG TABLET: 37.5 | 30 days supply | Qty: 30 | Fill #3

## 2020-11-11 ENCOUNTER — Other Ambulatory Visit (HOSPITAL_COMMUNITY): Payer: Self-pay | Admitting: Obstetrics and Gynecology

## 2020-11-11 MED FILL — AMPHETAMINE-DEXTROAMPHETAMI: 30 | 30 days supply | Qty: 60 | Fill #0

## 2020-11-11 MED FILL — TEMAZEPAM 30 MG CAPSULE: 30 | 90 days supply | Qty: 90 | Fill #0

## 2020-12-16 ENCOUNTER — Other Ambulatory Visit (HOSPITAL_COMMUNITY): Payer: Self-pay | Admitting: Obstetrics and Gynecology

## 2020-12-17 MED FILL — metFORMIN HCL ER 500 MG TB2: 500 | 90 days supply | Qty: 90 | Fill #0

## 2020-12-17 MED FILL — spIRONOLACTONE 100 MG TAB: 100 | 90 days supply | Qty: 90 | Fill #0

## 2020-12-17 MED FILL — PROPRANOLOL HCL ER 120 MG C: 120 | 90 days supply | Qty: 90 | Fill #0

## 2020-12-18 ENCOUNTER — Other Ambulatory Visit (HOSPITAL_COMMUNITY): Payer: Self-pay | Admitting: Obstetrics and Gynecology

## 2020-12-19 MED FILL — AMPHETAMINE-DEXTROAMPHETAMI: 30 | 30 days supply | Qty: 60 | Fill #0

## 2020-12-19 MED FILL — PHENTERMINE 37.5 MG TABLET: 37.5 | 30 days supply | Qty: 30 | Fill #0

## 2021-01-01 ENCOUNTER — Other Ambulatory Visit (HOSPITAL_COMMUNITY): Payer: Self-pay | Admitting: Obstetrics and Gynecology

## 2021-01-01 MED FILL — ARMOUR THYROID 90 MG TABLET: 90 | 90 days supply | Qty: 90 | Fill #0

## 2021-01-27 ENCOUNTER — Other Ambulatory Visit (HOSPITAL_COMMUNITY): Payer: Self-pay | Admitting: Obstetrics and Gynecology

## 2021-01-27 MED FILL — PHENTERMINE 37.5 MG TABLET: 37.5 | 30 days supply | Qty: 30 | Fill #1

## 2021-01-27 MED FILL — AMPHETAMINE-DEXTROAMPHETAMI: 30 | 30 days supply | Qty: 60 | Fill #0

## 2021-02-07 MED FILL — TEMAZEPAM 30 MG CAPSULE: 30 | 90 days supply | Qty: 90 | Fill #1

## 2021-02-13 ENCOUNTER — Other Ambulatory Visit (HOSPITAL_COMMUNITY): Payer: Self-pay | Admitting: Obstetrics and Gynecology

## 2021-02-13 DIAGNOSIS — E282 Polycystic ovarian syndrome: Secondary | ICD-10-CM | POA: Diagnosis not present

## 2021-02-13 DIAGNOSIS — L659 Nonscarring hair loss, unspecified: Secondary | ICD-10-CM | POA: Diagnosis not present

## 2021-02-13 DIAGNOSIS — R21 Rash and other nonspecific skin eruption: Secondary | ICD-10-CM | POA: Diagnosis not present

## 2021-02-13 DIAGNOSIS — Z1231 Encounter for screening mammogram for malignant neoplasm of breast: Secondary | ICD-10-CM | POA: Diagnosis not present

## 2021-02-13 DIAGNOSIS — E559 Vitamin D deficiency, unspecified: Secondary | ICD-10-CM | POA: Diagnosis not present

## 2021-02-13 DIAGNOSIS — G8929 Other chronic pain: Secondary | ICD-10-CM | POA: Diagnosis not present

## 2021-02-13 DIAGNOSIS — Z6832 Body mass index (BMI) 32.0-32.9, adult: Secondary | ICD-10-CM | POA: Diagnosis not present

## 2021-02-13 DIAGNOSIS — N764 Abscess of vulva: Secondary | ICD-10-CM | POA: Diagnosis not present

## 2021-02-13 DIAGNOSIS — Z01419 Encounter for gynecological examination (general) (routine) without abnormal findings: Secondary | ICD-10-CM | POA: Diagnosis not present

## 2021-02-13 MED FILL — SULFAMETHOXAZOLE-TMP DS TAB: 800-160 | 7 days supply | Qty: 14 | Fill #0

## 2021-02-19 ENCOUNTER — Other Ambulatory Visit (HOSPITAL_COMMUNITY): Payer: Self-pay | Admitting: Obstetrics and Gynecology

## 2021-02-19 MED FILL — VIT D2 1.25 MG (50,000 UNIT: 1.25 MG | 70 days supply | Qty: 10 | Fill #0

## 2021-02-28 MED FILL — PHENTERMINE 37.5 MG TABLET: 37.5 | 30 days supply | Qty: 30 | Fill #2

## 2021-03-04 ENCOUNTER — Other Ambulatory Visit (HOSPITAL_COMMUNITY): Payer: Self-pay | Admitting: Obstetrics and Gynecology

## 2021-03-04 DIAGNOSIS — M5416 Radiculopathy, lumbar region: Secondary | ICD-10-CM | POA: Diagnosis not present

## 2021-03-04 DIAGNOSIS — M542 Cervicalgia: Secondary | ICD-10-CM | POA: Diagnosis not present

## 2021-03-05 MED FILL — AMPHETAMINE-DEXTROAMPHETAMI: 30 | 30 days supply | Qty: 60 | Fill #0

## 2021-03-13 ENCOUNTER — Telehealth: Payer: Self-pay | Admitting: Dermatology

## 2021-03-13 DIAGNOSIS — M542 Cervicalgia: Secondary | ICD-10-CM | POA: Diagnosis not present

## 2021-03-13 DIAGNOSIS — M545 Low back pain, unspecified: Secondary | ICD-10-CM | POA: Diagnosis not present

## 2021-03-13 DIAGNOSIS — M5416 Radiculopathy, lumbar region: Secondary | ICD-10-CM | POA: Diagnosis not present

## 2021-03-13 NOTE — Telephone Encounter (Signed)
Notes documented in referral notes. External referral. Called and LM at referring office letting referral coordinator know that patient would like referral sent to someone that can see her sooner.

## 2021-03-13 NOTE — Telephone Encounter (Signed)
Referral, won't wait until August. Ask referrer to try elsewhere for sooner

## 2021-03-21 ENCOUNTER — Other Ambulatory Visit (HOSPITAL_BASED_OUTPATIENT_CLINIC_OR_DEPARTMENT_OTHER): Payer: Self-pay

## 2021-03-24 ENCOUNTER — Other Ambulatory Visit (HOSPITAL_COMMUNITY): Payer: Self-pay | Admitting: Obstetrics and Gynecology

## 2021-03-24 MED FILL — SPIRONOLACTONE 100 MG TAB: 100 | 90 days supply | Qty: 90 | Fill #0

## 2021-03-24 MED FILL — PROPRANOLOL HCL ER 120 MG C: 120 | 90 days supply | Qty: 90 | Fill #0

## 2021-03-24 MED FILL — metFORMIN HCL ER 500 MG TB2: 500 | 90 days supply | Qty: 90 | Fill #0

## 2021-03-25 DIAGNOSIS — M545 Low back pain, unspecified: Secondary | ICD-10-CM | POA: Diagnosis not present

## 2021-03-25 DIAGNOSIS — Z6831 Body mass index (BMI) 31.0-31.9, adult: Secondary | ICD-10-CM | POA: Diagnosis not present

## 2021-03-25 DIAGNOSIS — R030 Elevated blood-pressure reading, without diagnosis of hypertension: Secondary | ICD-10-CM | POA: Diagnosis not present

## 2021-03-26 DIAGNOSIS — M5386 Other specified dorsopathies, lumbar region: Secondary | ICD-10-CM | POA: Diagnosis not present

## 2021-03-26 DIAGNOSIS — M9903 Segmental and somatic dysfunction of lumbar region: Secondary | ICD-10-CM | POA: Diagnosis not present

## 2021-03-26 DIAGNOSIS — M50322 Other cervical disc degeneration at C5-C6 level: Secondary | ICD-10-CM | POA: Diagnosis not present

## 2021-03-26 DIAGNOSIS — L280 Lichen simplex chronicus: Secondary | ICD-10-CM | POA: Diagnosis not present

## 2021-03-26 DIAGNOSIS — L83 Acanthosis nigricans: Secondary | ICD-10-CM | POA: Diagnosis not present

## 2021-03-26 DIAGNOSIS — M9901 Segmental and somatic dysfunction of cervical region: Secondary | ICD-10-CM | POA: Diagnosis not present

## 2021-03-26 DIAGNOSIS — B078 Other viral warts: Secondary | ICD-10-CM | POA: Diagnosis not present

## 2021-03-31 ENCOUNTER — Other Ambulatory Visit: Payer: Self-pay | Admitting: Obstetrics and Gynecology

## 2021-03-31 ENCOUNTER — Other Ambulatory Visit (HOSPITAL_COMMUNITY): Payer: Self-pay

## 2021-03-31 DIAGNOSIS — M50322 Other cervical disc degeneration at C5-C6 level: Secondary | ICD-10-CM | POA: Diagnosis not present

## 2021-03-31 DIAGNOSIS — M5386 Other specified dorsopathies, lumbar region: Secondary | ICD-10-CM | POA: Diagnosis not present

## 2021-03-31 DIAGNOSIS — M9901 Segmental and somatic dysfunction of cervical region: Secondary | ICD-10-CM | POA: Diagnosis not present

## 2021-03-31 DIAGNOSIS — M9903 Segmental and somatic dysfunction of lumbar region: Secondary | ICD-10-CM | POA: Diagnosis not present

## 2021-04-01 DIAGNOSIS — M9901 Segmental and somatic dysfunction of cervical region: Secondary | ICD-10-CM | POA: Diagnosis not present

## 2021-04-01 DIAGNOSIS — M5386 Other specified dorsopathies, lumbar region: Secondary | ICD-10-CM | POA: Diagnosis not present

## 2021-04-01 DIAGNOSIS — M9903 Segmental and somatic dysfunction of lumbar region: Secondary | ICD-10-CM | POA: Diagnosis not present

## 2021-04-01 DIAGNOSIS — M50322 Other cervical disc degeneration at C5-C6 level: Secondary | ICD-10-CM | POA: Diagnosis not present

## 2021-04-03 ENCOUNTER — Other Ambulatory Visit (HOSPITAL_COMMUNITY): Payer: Self-pay

## 2021-04-03 ENCOUNTER — Other Ambulatory Visit: Payer: Self-pay | Admitting: Obstetrics and Gynecology

## 2021-04-03 DIAGNOSIS — M9903 Segmental and somatic dysfunction of lumbar region: Secondary | ICD-10-CM | POA: Diagnosis not present

## 2021-04-03 DIAGNOSIS — M50322 Other cervical disc degeneration at C5-C6 level: Secondary | ICD-10-CM | POA: Diagnosis not present

## 2021-04-03 DIAGNOSIS — M9901 Segmental and somatic dysfunction of cervical region: Secondary | ICD-10-CM | POA: Diagnosis not present

## 2021-04-03 DIAGNOSIS — M5386 Other specified dorsopathies, lumbar region: Secondary | ICD-10-CM | POA: Diagnosis not present

## 2021-04-03 MED FILL — Phentermine HCl Tab 37.5 MG: ORAL | 30 days supply | Qty: 30 | Fill #0 | Status: AC

## 2021-04-07 DIAGNOSIS — M9901 Segmental and somatic dysfunction of cervical region: Secondary | ICD-10-CM | POA: Diagnosis not present

## 2021-04-07 DIAGNOSIS — M5386 Other specified dorsopathies, lumbar region: Secondary | ICD-10-CM | POA: Diagnosis not present

## 2021-04-07 DIAGNOSIS — M50322 Other cervical disc degeneration at C5-C6 level: Secondary | ICD-10-CM | POA: Diagnosis not present

## 2021-04-07 DIAGNOSIS — M9903 Segmental and somatic dysfunction of lumbar region: Secondary | ICD-10-CM | POA: Diagnosis not present

## 2021-04-08 ENCOUNTER — Other Ambulatory Visit (HOSPITAL_COMMUNITY): Payer: Self-pay

## 2021-04-08 DIAGNOSIS — M9901 Segmental and somatic dysfunction of cervical region: Secondary | ICD-10-CM | POA: Diagnosis not present

## 2021-04-08 DIAGNOSIS — M9903 Segmental and somatic dysfunction of lumbar region: Secondary | ICD-10-CM | POA: Diagnosis not present

## 2021-04-08 DIAGNOSIS — M5386 Other specified dorsopathies, lumbar region: Secondary | ICD-10-CM | POA: Diagnosis not present

## 2021-04-08 DIAGNOSIS — M50322 Other cervical disc degeneration at C5-C6 level: Secondary | ICD-10-CM | POA: Diagnosis not present

## 2021-04-08 MED ORDER — ARMOUR THYROID 90 MG PO TABS
90.0000 mg | ORAL_TABLET | Freq: Every day | ORAL | 3 refills | Status: DC
  Filled 2021-04-08: qty 90, 90d supply, fill #0
  Filled 2021-07-01 – 2021-07-07 (×2): qty 90, 90d supply, fill #1

## 2021-04-08 MED ORDER — AMPHETAMINE-DEXTROAMPHETAMINE 30 MG PO TABS
2.0000 | ORAL_TABLET | Freq: Every day | ORAL | 0 refills | Status: DC
  Filled 2021-04-08: qty 60, 30d supply, fill #0

## 2021-04-10 ENCOUNTER — Other Ambulatory Visit (HOSPITAL_COMMUNITY): Payer: Self-pay

## 2021-04-10 DIAGNOSIS — M9901 Segmental and somatic dysfunction of cervical region: Secondary | ICD-10-CM | POA: Diagnosis not present

## 2021-04-10 DIAGNOSIS — M5386 Other specified dorsopathies, lumbar region: Secondary | ICD-10-CM | POA: Diagnosis not present

## 2021-04-10 DIAGNOSIS — M50322 Other cervical disc degeneration at C5-C6 level: Secondary | ICD-10-CM | POA: Diagnosis not present

## 2021-04-10 DIAGNOSIS — M9903 Segmental and somatic dysfunction of lumbar region: Secondary | ICD-10-CM | POA: Diagnosis not present

## 2021-04-11 ENCOUNTER — Other Ambulatory Visit (HOSPITAL_COMMUNITY): Payer: Self-pay

## 2021-04-14 DIAGNOSIS — M50322 Other cervical disc degeneration at C5-C6 level: Secondary | ICD-10-CM | POA: Diagnosis not present

## 2021-04-14 DIAGNOSIS — M9903 Segmental and somatic dysfunction of lumbar region: Secondary | ICD-10-CM | POA: Diagnosis not present

## 2021-04-14 DIAGNOSIS — M5386 Other specified dorsopathies, lumbar region: Secondary | ICD-10-CM | POA: Diagnosis not present

## 2021-04-14 DIAGNOSIS — M9901 Segmental and somatic dysfunction of cervical region: Secondary | ICD-10-CM | POA: Diagnosis not present

## 2021-04-15 DIAGNOSIS — M9901 Segmental and somatic dysfunction of cervical region: Secondary | ICD-10-CM | POA: Diagnosis not present

## 2021-04-15 DIAGNOSIS — M5386 Other specified dorsopathies, lumbar region: Secondary | ICD-10-CM | POA: Diagnosis not present

## 2021-04-15 DIAGNOSIS — M9903 Segmental and somatic dysfunction of lumbar region: Secondary | ICD-10-CM | POA: Diagnosis not present

## 2021-04-15 DIAGNOSIS — M50322 Other cervical disc degeneration at C5-C6 level: Secondary | ICD-10-CM | POA: Diagnosis not present

## 2021-04-16 ENCOUNTER — Other Ambulatory Visit (HOSPITAL_COMMUNITY): Payer: Self-pay

## 2021-04-16 MED ORDER — THYROID 90 MG PO TABS
ORAL_TABLET | Freq: Every day | ORAL | 0 refills | Status: DC
  Filled 2021-04-16: qty 90, fill #0

## 2021-04-17 ENCOUNTER — Other Ambulatory Visit (HOSPITAL_COMMUNITY): Payer: Self-pay

## 2021-04-17 DIAGNOSIS — M9903 Segmental and somatic dysfunction of lumbar region: Secondary | ICD-10-CM | POA: Diagnosis not present

## 2021-04-17 DIAGNOSIS — M5386 Other specified dorsopathies, lumbar region: Secondary | ICD-10-CM | POA: Diagnosis not present

## 2021-04-17 DIAGNOSIS — M50322 Other cervical disc degeneration at C5-C6 level: Secondary | ICD-10-CM | POA: Diagnosis not present

## 2021-04-17 DIAGNOSIS — M9901 Segmental and somatic dysfunction of cervical region: Secondary | ICD-10-CM | POA: Diagnosis not present

## 2021-04-21 DIAGNOSIS — L820 Inflamed seborrheic keratosis: Secondary | ICD-10-CM | POA: Diagnosis not present

## 2021-04-21 DIAGNOSIS — B078 Other viral warts: Secondary | ICD-10-CM | POA: Diagnosis not present

## 2021-04-21 DIAGNOSIS — L280 Lichen simplex chronicus: Secondary | ICD-10-CM | POA: Diagnosis not present

## 2021-04-22 DIAGNOSIS — M5386 Other specified dorsopathies, lumbar region: Secondary | ICD-10-CM | POA: Diagnosis not present

## 2021-04-22 DIAGNOSIS — M9901 Segmental and somatic dysfunction of cervical region: Secondary | ICD-10-CM | POA: Diagnosis not present

## 2021-04-22 DIAGNOSIS — M9903 Segmental and somatic dysfunction of lumbar region: Secondary | ICD-10-CM | POA: Diagnosis not present

## 2021-04-22 DIAGNOSIS — M50322 Other cervical disc degeneration at C5-C6 level: Secondary | ICD-10-CM | POA: Diagnosis not present

## 2021-04-24 DIAGNOSIS — M5386 Other specified dorsopathies, lumbar region: Secondary | ICD-10-CM | POA: Diagnosis not present

## 2021-04-24 DIAGNOSIS — M50322 Other cervical disc degeneration at C5-C6 level: Secondary | ICD-10-CM | POA: Diagnosis not present

## 2021-04-24 DIAGNOSIS — M9903 Segmental and somatic dysfunction of lumbar region: Secondary | ICD-10-CM | POA: Diagnosis not present

## 2021-04-24 DIAGNOSIS — M9901 Segmental and somatic dysfunction of cervical region: Secondary | ICD-10-CM | POA: Diagnosis not present

## 2021-04-28 DIAGNOSIS — M50322 Other cervical disc degeneration at C5-C6 level: Secondary | ICD-10-CM | POA: Diagnosis not present

## 2021-04-28 DIAGNOSIS — M5386 Other specified dorsopathies, lumbar region: Secondary | ICD-10-CM | POA: Diagnosis not present

## 2021-04-28 DIAGNOSIS — M9903 Segmental and somatic dysfunction of lumbar region: Secondary | ICD-10-CM | POA: Diagnosis not present

## 2021-04-28 DIAGNOSIS — M9901 Segmental and somatic dysfunction of cervical region: Secondary | ICD-10-CM | POA: Diagnosis not present

## 2021-04-30 DIAGNOSIS — M47812 Spondylosis without myelopathy or radiculopathy, cervical region: Secondary | ICD-10-CM | POA: Diagnosis not present

## 2021-04-30 DIAGNOSIS — M47816 Spondylosis without myelopathy or radiculopathy, lumbar region: Secondary | ICD-10-CM | POA: Diagnosis not present

## 2021-05-01 ENCOUNTER — Other Ambulatory Visit: Payer: Self-pay | Admitting: Obstetrics and Gynecology

## 2021-05-01 ENCOUNTER — Other Ambulatory Visit (HOSPITAL_COMMUNITY): Payer: Self-pay

## 2021-05-01 DIAGNOSIS — M5386 Other specified dorsopathies, lumbar region: Secondary | ICD-10-CM | POA: Diagnosis not present

## 2021-05-01 DIAGNOSIS — M9903 Segmental and somatic dysfunction of lumbar region: Secondary | ICD-10-CM | POA: Diagnosis not present

## 2021-05-01 DIAGNOSIS — M50322 Other cervical disc degeneration at C5-C6 level: Secondary | ICD-10-CM | POA: Diagnosis not present

## 2021-05-01 DIAGNOSIS — M9901 Segmental and somatic dysfunction of cervical region: Secondary | ICD-10-CM | POA: Diagnosis not present

## 2021-05-01 MED ORDER — AMPHETAMINE-DEXTROAMPHETAMINE 30 MG PO TABS
2.0000 | ORAL_TABLET | Freq: Every day | ORAL | 0 refills | Status: DC
  Filled 2021-05-05: qty 60, 30d supply, fill #0

## 2021-05-01 MED ORDER — PHENTERMINE HCL 37.5 MG PO TABS
ORAL_TABLET | Freq: Every morning | ORAL | 3 refills | Status: DC
  Filled 2021-05-01: qty 30, 30d supply, fill #0
  Filled 2021-05-29: qty 30, 30d supply, fill #1
  Filled 2021-07-01: qty 30, 30d supply, fill #2
  Filled 2021-07-31 – 2021-08-04 (×2): qty 30, 30d supply, fill #3

## 2021-05-01 MED FILL — Ergocalciferol Cap 1.25 MG (50000 Unit): ORAL | 70 days supply | Qty: 10 | Fill #0 | Status: AC

## 2021-05-01 MED FILL — Temazepam Cap 30 MG: ORAL | 90 days supply | Qty: 90 | Fill #0 | Status: AC

## 2021-05-05 ENCOUNTER — Other Ambulatory Visit (HOSPITAL_COMMUNITY): Payer: Self-pay

## 2021-05-07 DIAGNOSIS — M9903 Segmental and somatic dysfunction of lumbar region: Secondary | ICD-10-CM | POA: Diagnosis not present

## 2021-05-07 DIAGNOSIS — M50322 Other cervical disc degeneration at C5-C6 level: Secondary | ICD-10-CM | POA: Diagnosis not present

## 2021-05-07 DIAGNOSIS — M5386 Other specified dorsopathies, lumbar region: Secondary | ICD-10-CM | POA: Diagnosis not present

## 2021-05-07 DIAGNOSIS — M9901 Segmental and somatic dysfunction of cervical region: Secondary | ICD-10-CM | POA: Diagnosis not present

## 2021-05-12 DIAGNOSIS — B078 Other viral warts: Secondary | ICD-10-CM | POA: Diagnosis not present

## 2021-05-14 DIAGNOSIS — M5386 Other specified dorsopathies, lumbar region: Secondary | ICD-10-CM | POA: Diagnosis not present

## 2021-05-14 DIAGNOSIS — M9901 Segmental and somatic dysfunction of cervical region: Secondary | ICD-10-CM | POA: Diagnosis not present

## 2021-05-14 DIAGNOSIS — M50322 Other cervical disc degeneration at C5-C6 level: Secondary | ICD-10-CM | POA: Diagnosis not present

## 2021-05-14 DIAGNOSIS — M9903 Segmental and somatic dysfunction of lumbar region: Secondary | ICD-10-CM | POA: Diagnosis not present

## 2021-05-21 DIAGNOSIS — M9901 Segmental and somatic dysfunction of cervical region: Secondary | ICD-10-CM | POA: Diagnosis not present

## 2021-05-21 DIAGNOSIS — M50322 Other cervical disc degeneration at C5-C6 level: Secondary | ICD-10-CM | POA: Diagnosis not present

## 2021-05-21 DIAGNOSIS — M9903 Segmental and somatic dysfunction of lumbar region: Secondary | ICD-10-CM | POA: Diagnosis not present

## 2021-05-21 DIAGNOSIS — M5386 Other specified dorsopathies, lumbar region: Secondary | ICD-10-CM | POA: Diagnosis not present

## 2021-05-28 DIAGNOSIS — M9903 Segmental and somatic dysfunction of lumbar region: Secondary | ICD-10-CM | POA: Diagnosis not present

## 2021-05-28 DIAGNOSIS — M50322 Other cervical disc degeneration at C5-C6 level: Secondary | ICD-10-CM | POA: Diagnosis not present

## 2021-05-28 DIAGNOSIS — M9901 Segmental and somatic dysfunction of cervical region: Secondary | ICD-10-CM | POA: Diagnosis not present

## 2021-05-28 DIAGNOSIS — M5386 Other specified dorsopathies, lumbar region: Secondary | ICD-10-CM | POA: Diagnosis not present

## 2021-05-29 ENCOUNTER — Other Ambulatory Visit (HOSPITAL_COMMUNITY): Payer: Self-pay

## 2021-05-29 MED ORDER — AMPHETAMINE-DEXTROAMPHETAMINE 30 MG PO TABS
ORAL_TABLET | ORAL | 0 refills | Status: DC
  Filled 2021-06-04: qty 60, 30d supply, fill #0

## 2021-05-30 ENCOUNTER — Other Ambulatory Visit (HOSPITAL_COMMUNITY): Payer: Self-pay

## 2021-06-04 ENCOUNTER — Other Ambulatory Visit (HOSPITAL_COMMUNITY): Payer: Self-pay

## 2021-06-09 DIAGNOSIS — L83 Acanthosis nigricans: Secondary | ICD-10-CM | POA: Diagnosis not present

## 2021-06-09 DIAGNOSIS — B078 Other viral warts: Secondary | ICD-10-CM | POA: Diagnosis not present

## 2021-06-09 DIAGNOSIS — L820 Inflamed seborrheic keratosis: Secondary | ICD-10-CM | POA: Diagnosis not present

## 2021-06-19 ENCOUNTER — Other Ambulatory Visit (HOSPITAL_COMMUNITY): Payer: Self-pay

## 2021-06-19 MED FILL — Metformin HCl Tab ER 24HR 500 MG: ORAL | 90 days supply | Qty: 90 | Fill #0 | Status: AC

## 2021-06-19 MED FILL — Propranolol HCl Cap ER 24HR 120 MG: ORAL | 90 days supply | Qty: 90 | Fill #0 | Status: AC

## 2021-06-19 MED FILL — Spironolactone Tab 100 MG: ORAL | 90 days supply | Qty: 90 | Fill #0 | Status: AC

## 2021-07-01 ENCOUNTER — Other Ambulatory Visit (HOSPITAL_COMMUNITY): Payer: Self-pay

## 2021-07-01 MED FILL — Ergocalciferol Cap 1.25 MG (50000 Unit): ORAL | 70 days supply | Qty: 10 | Fill #1 | Status: AC

## 2021-07-02 ENCOUNTER — Other Ambulatory Visit (HOSPITAL_COMMUNITY): Payer: Self-pay

## 2021-07-03 ENCOUNTER — Other Ambulatory Visit (HOSPITAL_COMMUNITY): Payer: Self-pay

## 2021-07-03 MED ORDER — AMPHETAMINE-DEXTROAMPHETAMINE 30 MG PO TABS
2.0000 | ORAL_TABLET | Freq: Every day | ORAL | 0 refills | Status: DC
  Filled 2021-07-03: qty 60, 30d supply, fill #0

## 2021-07-07 ENCOUNTER — Other Ambulatory Visit (HOSPITAL_COMMUNITY): Payer: Self-pay

## 2021-07-09 ENCOUNTER — Other Ambulatory Visit (HOSPITAL_COMMUNITY): Payer: Self-pay

## 2021-07-09 MED ORDER — LEVOTHYROXINE SODIUM 137 MCG PO TABS
ORAL_TABLET | ORAL | 3 refills | Status: DC
  Filled 2021-07-09: qty 90, 90d supply, fill #0
  Filled 2021-10-06: qty 90, 90d supply, fill #1

## 2021-07-30 ENCOUNTER — Other Ambulatory Visit (HOSPITAL_COMMUNITY): Payer: Self-pay

## 2021-07-30 ENCOUNTER — Other Ambulatory Visit: Payer: Self-pay

## 2021-07-31 ENCOUNTER — Other Ambulatory Visit (HOSPITAL_COMMUNITY): Payer: Self-pay

## 2021-07-31 MED ORDER — TEMAZEPAM 30 MG PO CAPS
30.0000 mg | ORAL_CAPSULE | Freq: Every evening | ORAL | 0 refills | Status: DC
  Filled 2021-07-31: qty 90, 90d supply, fill #0

## 2021-07-31 MED ORDER — AMPHETAMINE-DEXTROAMPHETAMINE 30 MG PO TABS
2.0000 | ORAL_TABLET | Freq: Every day | ORAL | 0 refills | Status: DC
  Filled 2021-07-31: qty 60, 30d supply, fill #0

## 2021-08-01 ENCOUNTER — Other Ambulatory Visit (HOSPITAL_COMMUNITY): Payer: Self-pay

## 2021-08-04 ENCOUNTER — Other Ambulatory Visit (HOSPITAL_COMMUNITY): Payer: Self-pay

## 2021-08-29 ENCOUNTER — Other Ambulatory Visit (HOSPITAL_COMMUNITY): Payer: Self-pay

## 2021-08-29 ENCOUNTER — Other Ambulatory Visit: Payer: Self-pay

## 2021-08-29 MED ORDER — AMPHETAMINE-DEXTROAMPHETAMINE 30 MG PO TABS
2.0000 | ORAL_TABLET | Freq: Every day | ORAL | 0 refills | Status: DC
  Filled 2021-08-29: qty 60, 30d supply, fill #0

## 2021-08-29 MED ORDER — PHENTERMINE HCL 37.5 MG PO TABS
37.5000 mg | ORAL_TABLET | Freq: Every morning | ORAL | 3 refills | Status: DC
  Filled 2021-09-02: qty 30, 30d supply, fill #0
  Filled 2021-09-30: qty 30, 30d supply, fill #1
  Filled 2021-10-29: qty 30, 30d supply, fill #2
  Filled 2021-11-27: qty 30, 30d supply, fill #3

## 2021-09-02 ENCOUNTER — Other Ambulatory Visit (HOSPITAL_COMMUNITY): Payer: Self-pay

## 2021-09-15 ENCOUNTER — Other Ambulatory Visit (HOSPITAL_COMMUNITY): Payer: Self-pay

## 2021-09-15 MED FILL — Metformin HCl Tab ER 24HR 500 MG: ORAL | 90 days supply | Qty: 90 | Fill #1 | Status: AC

## 2021-09-15 MED FILL — Ergocalciferol Cap 1.25 MG (50000 Unit): ORAL | 70 days supply | Qty: 10 | Fill #2 | Status: AC

## 2021-09-15 MED FILL — Propranolol HCl Cap ER 24HR 120 MG: ORAL | 90 days supply | Qty: 90 | Fill #1 | Status: AC

## 2021-09-15 MED FILL — Spironolactone Tab 100 MG: ORAL | 90 days supply | Qty: 90 | Fill #1 | Status: AC

## 2021-09-30 ENCOUNTER — Other Ambulatory Visit (HOSPITAL_COMMUNITY): Payer: Self-pay

## 2021-10-01 ENCOUNTER — Other Ambulatory Visit (HOSPITAL_COMMUNITY): Payer: Self-pay

## 2021-10-01 MED ORDER — AMPHETAMINE-DEXTROAMPHETAMINE 30 MG PO TABS
2.0000 | ORAL_TABLET | Freq: Every day | ORAL | 0 refills | Status: DC
  Filled 2021-10-01: qty 60, 30d supply, fill #0

## 2021-10-02 ENCOUNTER — Other Ambulatory Visit (HOSPITAL_COMMUNITY): Payer: Self-pay

## 2021-10-06 ENCOUNTER — Other Ambulatory Visit (HOSPITAL_COMMUNITY): Payer: Self-pay

## 2021-10-29 ENCOUNTER — Other Ambulatory Visit (HOSPITAL_COMMUNITY): Payer: Self-pay

## 2021-10-30 ENCOUNTER — Other Ambulatory Visit (HOSPITAL_COMMUNITY): Payer: Self-pay

## 2021-10-30 MED ORDER — TEMAZEPAM 30 MG PO CAPS
30.0000 mg | ORAL_CAPSULE | Freq: Every evening | ORAL | 0 refills | Status: DC
  Filled 2021-10-30: qty 90, 90d supply, fill #0

## 2021-10-30 MED ORDER — AMPHETAMINE-DEXTROAMPHETAMINE 30 MG PO TABS
2.0000 | ORAL_TABLET | Freq: Every day | ORAL | 0 refills | Status: DC
  Filled 2021-10-30: qty 60, 30d supply, fill #0

## 2021-11-14 ENCOUNTER — Telehealth: Payer: 59 | Admitting: Physician Assistant

## 2021-11-14 ENCOUNTER — Other Ambulatory Visit (HOSPITAL_BASED_OUTPATIENT_CLINIC_OR_DEPARTMENT_OTHER): Payer: Self-pay

## 2021-11-14 DIAGNOSIS — B9689 Other specified bacterial agents as the cause of diseases classified elsewhere: Secondary | ICD-10-CM

## 2021-11-14 DIAGNOSIS — J019 Acute sinusitis, unspecified: Secondary | ICD-10-CM | POA: Diagnosis not present

## 2021-11-14 MED ORDER — AMOXICILLIN-POT CLAVULANATE 875-125 MG PO TABS
1.0000 | ORAL_TABLET | Freq: Two times a day (BID) | ORAL | 0 refills | Status: DC
  Filled 2021-11-14: qty 14, 7d supply, fill #0

## 2021-11-14 MED ORDER — PREDNISONE 20 MG PO TABS
40.0000 mg | ORAL_TABLET | Freq: Every day | ORAL | 0 refills | Status: DC
Start: 2021-11-14 — End: 2022-01-14
  Filled 2021-11-14: qty 10, 5d supply, fill #0

## 2021-11-14 NOTE — Progress Notes (Signed)
E-Visit for Sinus Problems  We are sorry that you are not feeling well.  Here is how we plan to help!  Based on what you have shared with me it looks like you have sinusitis.  Sinusitis is inflammation and infection in the sinus cavities of the head.  Based on your presentation I believe you most likely have Acute Bacterial Sinusitis.  This is an infection caused by bacteria and is treated with antibiotics. I have prescribed Augmentin 875mg /125mg  one tablet twice daily with food, for 7 days. Prednisone 20mg  Take 2 tablets at breakfast for 5 days. You may use an oral decongestant such as Mucinex D or if you have glaucoma or high blood pressure use plain Mucinex. Saline nasal spray help and can safely be used as often as needed for congestion.  If you develop worsening sinus pain, fever or notice severe headache and vision changes, or if symptoms are not better after completion of antibiotic, please schedule an appointment with a health care provider.    Sinus infections are not as easily transmitted as other respiratory infection, however we still recommend that you avoid close contact with loved ones, especially the very young and elderly.  Remember to wash your hands thoroughly throughout the day as this is the number one way to prevent the spread of infection!  Home Care: Only take medications as instructed by your medical team. Complete the entire course of an antibiotic. Do not take these medications with alcohol. A steam or ultrasonic humidifier can help congestion.  You can place a towel over your head and breathe in the steam from hot water coming from a faucet. Avoid close contacts especially the very young and the elderly. Cover your mouth when you cough or sneeze. Always remember to wash your hands.  Get Help Right Away If: You develop worsening fever or sinus pain. You develop a severe head ache or visual changes. Your symptoms persist after you have completed your treatment  plan.  Make sure you Understand these instructions. Will watch your condition. Will get help right away if you are not doing well or get worse.  Thank you for choosing an e-visit.  Your e-visit answers were reviewed by a board certified advanced clinical practitioner to complete your personal care plan. Depending upon the condition, your plan could have included both over the counter or prescription medications.  Please review your pharmacy choice. Make sure the pharmacy is open so you can pick up prescription now. If there is a problem, you may contact your provider through [employer] and have the prescription routed to another pharmacy.  Your safety is important to [place]. If you have drug allergies check your prescription carefully.   For the next 24 hours you can use MyChart to ask questions about today's visit, request a non-urgent call back, or ask for a work or school excuse. You will get an email in the next two days asking about your experience. I hope that your e-visit has been valuable and will speed your recovery.  I provided 5 minutes of non face-to-face time during this encounter for chart review and documentation.

## 2021-11-15 ENCOUNTER — Other Ambulatory Visit (HOSPITAL_COMMUNITY): Payer: Self-pay

## 2021-11-27 ENCOUNTER — Other Ambulatory Visit (HOSPITAL_COMMUNITY): Payer: Self-pay

## 2021-11-27 MED ORDER — AMPHETAMINE-DEXTROAMPHETAMINE 30 MG PO TABS
2.0000 | ORAL_TABLET | Freq: Every day | ORAL | 0 refills | Status: DC
  Filled 2021-11-27: qty 60, 30d supply, fill #0

## 2021-12-17 ENCOUNTER — Other Ambulatory Visit (HOSPITAL_COMMUNITY): Payer: Self-pay

## 2021-12-17 MED FILL — Propranolol HCl Cap ER 24HR 120 MG: ORAL | 90 days supply | Qty: 90 | Fill #2 | Status: AC

## 2021-12-17 MED FILL — Metformin HCl Tab ER 24HR 500 MG: ORAL | 90 days supply | Qty: 90 | Fill #2 | Status: AC

## 2021-12-17 MED FILL — Spironolactone Tab 100 MG: ORAL | 90 days supply | Qty: 90 | Fill #2 | Status: AC

## 2021-12-30 ENCOUNTER — Other Ambulatory Visit (HOSPITAL_COMMUNITY): Payer: Self-pay

## 2021-12-30 MED ORDER — AMPHETAMINE-DEXTROAMPHETAMINE 30 MG PO TABS
2.0000 | ORAL_TABLET | Freq: Every day | ORAL | 0 refills | Status: DC
  Filled 2021-12-30: qty 60, 30d supply, fill #0

## 2021-12-30 MED ORDER — PHENTERMINE HCL 37.5 MG PO TABS
37.5000 mg | ORAL_TABLET | Freq: Every morning | ORAL | 3 refills | Status: DC
  Filled 2021-12-30: qty 30, 30d supply, fill #0

## 2022-01-14 ENCOUNTER — Encounter: Payer: Self-pay | Admitting: Family Medicine

## 2022-01-14 ENCOUNTER — Other Ambulatory Visit: Payer: Self-pay

## 2022-01-14 ENCOUNTER — Ambulatory Visit: Payer: 59 | Admitting: Family Medicine

## 2022-01-14 VITALS — BP 124/82 | HR 78 | Temp 98.2°F | Ht 67.0 in | Wt 218.1 lb

## 2022-01-14 DIAGNOSIS — G43009 Migraine without aura, not intractable, without status migrainosus: Secondary | ICD-10-CM

## 2022-01-14 DIAGNOSIS — F5101 Primary insomnia: Secondary | ICD-10-CM | POA: Diagnosis not present

## 2022-01-14 DIAGNOSIS — E6609 Other obesity due to excess calories: Secondary | ICD-10-CM

## 2022-01-14 DIAGNOSIS — G8929 Other chronic pain: Secondary | ICD-10-CM | POA: Diagnosis not present

## 2022-01-14 DIAGNOSIS — Z79899 Other long term (current) drug therapy: Secondary | ICD-10-CM

## 2022-01-14 DIAGNOSIS — E063 Autoimmune thyroiditis: Secondary | ICD-10-CM

## 2022-01-14 DIAGNOSIS — Z6834 Body mass index (BMI) 34.0-34.9, adult: Secondary | ICD-10-CM | POA: Diagnosis not present

## 2022-01-14 DIAGNOSIS — E8881 Metabolic syndrome: Secondary | ICD-10-CM | POA: Diagnosis not present

## 2022-01-14 DIAGNOSIS — M5442 Lumbago with sciatica, left side: Secondary | ICD-10-CM

## 2022-01-14 LAB — CBC
HCT: 43.2 % (ref 36.0–46.0)
Hemoglobin: 14.4 g/dL (ref 12.0–15.0)
MCHC: 33.3 g/dL (ref 30.0–36.0)
MCV: 89.3 fl (ref 78.0–100.0)
Platelets: 317 10*3/uL (ref 150.0–400.0)
RBC: 4.84 Mil/uL (ref 3.87–5.11)
RDW: 13.2 % (ref 11.5–15.5)
WBC: 5.5 10*3/uL (ref 4.0–10.5)

## 2022-01-14 LAB — LIPID PANEL
Cholesterol: 246 mg/dL — ABNORMAL HIGH (ref 0–200)
HDL: 39.4 mg/dL (ref >39.00)
LDL Cholesterol: 172 mg/dL — ABNORMAL HIGH (ref 0–99)
NonHDL: 206.87
Total CHOL/HDL Ratio: 6
Triglycerides: 175 mg/dL — ABNORMAL HIGH (ref 0.0–149.0)
VLDL: 35 mg/dL (ref 0.0–40.0)

## 2022-01-14 LAB — COMPREHENSIVE METABOLIC PANEL
ALT: 27 U/L (ref 0–35)
AST: 19 U/L (ref 0–37)
Albumin: 4.7 g/dL (ref 3.5–5.2)
Alkaline Phosphatase: 94 U/L (ref 39–117)
BUN: 14 mg/dL (ref 6–23)
CO2: 26 mEq/L (ref 19–32)
Calcium: 9.8 mg/dL (ref 8.4–10.5)
Chloride: 103 mEq/L (ref 96–112)
Creatinine, Ser: 0.88 mg/dL (ref 0.40–1.20)
GFR: 76.7 mL/min (ref >60.00)
Glucose, Bld: 106 mg/dL — ABNORMAL HIGH (ref 70–99)
Potassium: 4.4 mEq/L (ref 3.5–5.1)
Sodium: 138 mEq/L (ref 135–145)
Total Bilirubin: 0.7 mg/dL (ref 0.2–1.2)
Total Protein: 7.3 g/dL (ref 6.0–8.3)

## 2022-01-14 LAB — T3, FREE: T3, Free: 6.3 pg/mL — ABNORMAL HIGH (ref 2.3–4.2)

## 2022-01-14 LAB — TSH: TSH: 2.55 u[IU]/mL (ref 0.35–5.50)

## 2022-01-14 LAB — VITAMIN B12: Vitamin B-12: 195 pg/mL — ABNORMAL LOW (ref 211–911)

## 2022-01-14 LAB — HEMOGLOBIN A1C: Hgb A1c MFr Bld: 6.6 % — ABNORMAL HIGH (ref 4.6–6.5)

## 2022-01-14 LAB — T4, FREE: Free T4: 1.05 ng/dL (ref 0.60–1.60)

## 2022-01-14 NOTE — Progress Notes (Signed)
Subjective:     Patient ID: Daisy Ramirez, female    DOB: March 11, 1971, 51 y.o.   MRN: [id number]  Chief Complaint  Patient presents with   Transfer of Care    Re-establish care Fasting for blood work to have thyroid checked     HPI Seeing Dr. Ovidio Kin. Sweats a lot-when active.  No cp/sob.  HRT not help  PCOS-facial hair, but hair thinning worse since off armour thyroid. Still on metformin/spironolactone  Hashimoto's since 51yo.  Was on Armour until 6 mo ago-now on synthroid(d/t cost of armour) but has gained 20#.  Hasn't checked levels since.   Migraine's no auras-propranolol helps, but still getting 0-2/wk and can last 2-3 days.  Has tried imitrex, etc-no help.    Insomnia-has been on temazepam for long time.  Helps some.  Not keep her asleep.  Doesn't want something that won't let her function if needs to get up in middle of night(ie ambien)  Surgical menopause-hot flashes(ERT not help), trouble focusing/concentrating, weight-so on adderall and phentermine.     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   Sinusitis 09/05/2015   Synovitis of right knee 11/11/2017   UTI (lower urinary tract infection)     Past Surgical History:  Procedure Laterality Date   ABDOMINAL HYSTERECTOMY  11/16/2012   Procedure: HYSTERECTOMY ABDOMINAL;  Surgeon: Cyril Mourning, MD;  Location: [facility];  Service: Gynecology;  Laterality: N/A;   bilateral knee scope  1997   CHONDROPLASTY Right 11/16/2017   Procedure: CHONDROPLASTY;  Surgeon: Elsie Saas, MD;  Location: [place];  Service: Orthopedics;  Laterality: Right;   KNEE ARTHROSCOPY WITH LATERAL MENISECTOMY Right 11/16/2017   Procedure: ARTHROSCOPY RIGHT KNEE WITH SYNOVECTOMY, CHONDROPLASTY, PARTIAL LATERAL MENISECTOMY;  Surgeon: Elsie Saas, MD;  Location: [place];  Service: Orthopedics;  Laterality: Right;   KNEE SURGERY     x 2   KNEE SURGERY     bilateral   SALPINGOOPHORECTOMY  11/16/2012   Procedure: SALPINGO OOPHORECTOMY;  Surgeon: Cyril Mourning, MD;  Location: [facility];  Service: Gynecology;  Laterality: Bilateral;    Outpatient Medications Prior to Visit  Medication Sig Dispense Refill   amphetamine-dextroamphetamine (ADDERALL) 30 MG tablet TAKE 2 TABLETS BY MOUTH ONCE A DAY 60 tablet 0   levothyroxine (SYNTHROID) 137 MCG tablet Take 1 tablet by mouth daily 90 tablet 3   metFORMIN (  GLUCOPHAGE-XR) 500 MG 24 hr tablet TAKE 1 TABLET BY MOUTH ONCE A DAY 90 tablet 3   propranolol ER (INDERAL LA) 120 MG 24 hr capsule TAKE 1 CAPSULE BY MOUTH ONCE A DAY 90 capsule 0   spironolactone (ALDACTONE) 100 MG tablet Take 100 mg daily by mouth.     temazepam (RESTORIL) 30 MG capsule TAKE 1 CAPSULE BY MOUTH EVERY EVENING 90 capsule 0   phentermine 37.5 MG capsule Take 37.5 mg by mouth every morning.     predniSONE (DELTASONE) 20 MG tablet Take 2 tablets (40 mg total) by mouth daily with breakfast. 10 tablet 0   albuterol (PROVENTIL HFA;VENTOLIN HFA) 108 (90  Base) MCG/ACT inhaler Inhale 2 puffs into the lungs every 6 (six) hours as needed for wheezing or shortness of breath. 1 Inhaler 0   amoxicillin-clavulanate (AUGMENTIN) 875-125 MG tablet Take 1 tablet by mouth 2 (two) times daily. 14 tablet 0   amphetamine-dextroamphetamine (ADDERALL) 30 MG tablet Take 60 mg by mouth daily.  0   amphetamine-dextroamphetamine (ADDERALL) 30 MG tablet TAKE 2 TABLETS BY MOUTH ONCE A DAY 60 tablet 0   amphetamine-dextroamphetamine (ADDERALL) 30 MG tablet TAKE 2 TABLETS BY MOUTH ONCE A DAY 60 tablet 0   amphetamine-dextroamphetamine (ADDERALL) 30 MG tablet TAKE 2 TABLETS BY MOUTH ONCE A DAY 60 tablet 0   amphetamine-dextroamphetamine (ADDERALL) 30 MG tablet Take 2 tablets by mouth daily. 60 tablet 0   apixaban (ELIQUIS) 2.5 MG TABS tablet Take 1 tablet (2.5 mg total) by mouth 2 (two) times daily. 30 tablet 0   fluticasone (FLONASE) 50 MCG/ACT nasal spray Place 2 sprays into both nostrils daily. 16 g 1   HYDROcodone-acetaminophen (NORCO) 5-325 MG tablet Take 1 tablet by mouth every 4 (four) hours as needed for moderate pain. 30 tablet 0   metFORMIN (GLUCOPHAGE-XR) 500 MG 24 hr tablet TAKE 1 TABLET BY MOUTH ONCE A DAY 90 tablet 0   norgestimate-ethinyl estradiol (ORTHO-CYCLEN,SPRINTEC,PREVIFEM) 0.25-35 MG-MCG tablet Take 1 tablet daily by mouth.     phentermine (ADIPEX-P) 37.5 MG tablet TAKE 1 TABLET BY MOUTH EVERY MORNING 30 tablet 3   phentermine (ADIPEX-P) 37.5 MG tablet TAKE 1 TABLET BY MOUTH EVERY MORNING 30 tablet 3   propranolol ER (INDERAL LA) 120 MG 24 hr capsule Take 1 capsule by mouth daily.  12   propranolol ER (INDERAL LA) 120 MG 24 hr capsule TAKE 1 CAPSULE BY MOUTH ONCE A DAY 90 capsule 3   spironolactone (ALDACTONE) 100 MG tablet TAKE 1 TABLET BY MOUTH ONCE DAILY 90 tablet 3   spironolactone (ALDACTONE) 100 MG tablet TAKE 1 TABLET BY MOUTH ONCE DAILY 90 tablet 0   sulfamethoxazole-trimethoprim (BACTRIM DS) 800-160 MG tablet TAKE 1 TABLET BY MOUTH EVERY 12  HOURS 14 tablet 0   temazepam (RESTORIL) 30 MG capsule Take 30 mg by mouth at bedtime.      temazepam (RESTORIL) 30 MG capsule TAKE 1 CAPSULE BY MOUTH EVERY EVENING 90 capsule 2   thyroid (ARMOUR THYROID) 90 MG tablet TAKE 1 TABLET BY MOUTH ONCE DAILY 90 tablet 0   thyroid (ARMOUR THYROID) 90 MG tablet TAKE 1 TABLET BY MOUTH ONCE DAILY 90 tablet 3   thyroid (NP THYROID) 90 MG tablet Take 90 mg by mouth daily.     Vitamin D, Ergocalciferol, (DRISDOL) 1.25 MG (50000 UNIT) CAPS capsule TAKE 1 CAPSULE BY MOUTH ONCE A WEEK 10 capsule 3   No facility-administered medications prior to visit.    No Known Allergies UXY:BFXOVANV/BTYOMAYOKHTXHFS except as  noted in HPI Tinnitis Sciatica R-seeing chiro R foot intermitt purple/red at times      Objective:     BP 124/82    Pulse 78    Temp 98.2 F (36.8 C) (Temporal)    Ht 5\' 7"  (1.702 m)    Wt 218 lb 2 oz (98.9 kg)    LMP 10/20/2012    SpO2 98%    BMI 34.16 kg/m  Wt Readings from Last 3 Encounters:  01/14/22 218 lb 2 oz (98.9 kg)  11/16/17 204 lb (92.5 kg)  11/10/16 210 lb (95.3 kg)        Gen: WDWN NAD OWF HEENT: NCAT, conjunctiva not injected, sclera nonicteric NECK:  supple, no thyromegaly, no nodes, no carotid bruits CARDIAC: RRR, S1S2+, no murmur. DP 2+B LUNGS: CTAB. No wheezes ABDOMEN:  BS+, soft, NTND, No HSM, no masses EXT:  no edema MSK: no gross abnormalities.  NEURO: A&O x3.  CN II-XII intact.  PSYCH: normal mood. Good eye contact  Assessment & Plan:   Problem List Items Addressed This Visit       Cardiovascular and Mediastinum   Migraine     Endocrine   HASHIMOTO'S THYROIDITIS - Primary   Relevant Orders   TSH   CBC   Comprehensive metabolic panel   Hemoglobin A1c   Lipid panel   T3, free   T4, free   Vitamin B12     Other   Obesity   Relevant Orders   Comprehensive metabolic panel   Hemoglobin A1c   Lipid panel   Other Visit Diagnoses     Chronic bilateral low back pain with left-sided sciatica        Insulin resistance       Relevant Orders   Comprehensive metabolic panel   Hemoglobin A1c   Lipid panel   Vitamin B12   Encounter for long-term (current) use of high-risk medication       Relevant Orders   DRUG MONITORING, PANEL 8 WITH CONFIRMATION, URINE   Primary insomnia          Hashimoto's thyroiditis-was changed from Armour to synthroid 6 mo ago.  Check labs. Insulin resistance(h/o PCOS-s/p hyst)-on metformin and spironolactone-check B12 d/t metformin.  Check A1C, lipids, CMP.  Work on [place] propranolol-not ideal.  Consider changing to topamax-f/u 1 mo to discuss as a lot going on Insomnia-chronic.  Consider newer class of drugs in future Sciatica-seeing chiro.  Work on stretches and wt loss Surgical menopause-diff concentrating-on Adderall-will work on weaning as a lot of sweats  F/u 1 mo  No orders of the defined types were placed in this encounter.   Wellington Hampshire, MD

## 2022-01-14 NOTE — Patient Instructions (Addendum)
Welcome to [facility]! It was a pleasure meeting you today.  As discussed, Please schedule a 1 month follow up visit today. Stop phentermine Work on Human resources officer.  PLEASE NOTE:  If you had any LAB tests please let us know if you have not heard back within a few days. You may see your results on MyChart before we have a chance to review them but we will give you a call once they are reviewed by [place]. If we ordered any REFERRALS today, please let us know if you have not heard from their office within the next week.  Let us know through MyChart if you are needing REFILLS, or have your pharmacy send [place] the request. You can also use MyChart to communicate with me or any office staff.  Please try these tips to maintain a healthy lifestyle:  Eat most of your calories during the day when you are active. Eliminate processed foods including packaged sweets (pies, cakes, cookies), reduce intake of potatoes, white bread, white pasta, and white rice. Look for whole grain options, oat flour or almond flour.  Each meal should contain half fruits/vegetables, one quarter protein, and one quarter carbs (no bigger than a computer mouse).  Cut down on sweet beverages. This includes juice, soda, and sweet tea. Also watch fruit intake, though this is a healthier sweet option, it still contains natural sugar! Limit to 3 servings daily.  Drink at least 1 glass of water with each meal and aim for at least 8 glasses per day  Exercise at least 150 minutes every week.

## 2022-01-15 ENCOUNTER — Other Ambulatory Visit (HOSPITAL_COMMUNITY): Payer: Self-pay

## 2022-01-15 ENCOUNTER — Other Ambulatory Visit: Payer: Self-pay | Admitting: *Deleted

## 2022-01-15 MED ORDER — LEVOTHYROXINE SODIUM 137 MCG PO TABS
ORAL_TABLET | ORAL | 3 refills | Status: DC
Start: 2022-01-15 — End: 2022-08-12
  Filled 2022-01-15: qty 90, 90d supply, fill #0
  Filled 2022-04-23: qty 90, 90d supply, fill #1
  Filled 2022-07-30: qty 45, 45d supply, fill #2
  Filled 2022-08-04: qty 45, 45d supply, fill #3

## 2022-01-16 LAB — DRUG MONITORING, PANEL 8 WITH CONFIRMATION, URINE
6 Acetylmorphine: NEGATIVE ng/mL (ref <10)
Alcohol Metabolites: NEGATIVE ng/mL (ref <500)
Alphahydroxyalprazolam: NEGATIVE ng/mL (ref <25)
Alphahydroxymidazolam: NEGATIVE ng/mL (ref <50)
Alphahydroxytriazolam: NEGATIVE ng/mL (ref <50)
Aminoclonazepam: NEGATIVE ng/mL (ref <25)
Amphetamine: 868 ng/mL — ABNORMAL HIGH (ref <250)
Amphetamines: POSITIVE ng/mL — AB (ref <500)
Benzodiazepines: POSITIVE ng/mL — AB (ref <100)
Buprenorphine, Urine: NEGATIVE ng/mL (ref <5)
Cocaine Metabolite: NEGATIVE ng/mL (ref <150)
Creatinine: 40.6 mg/dL (ref >=20.0)
Hydroxyethylflurazepam: NEGATIVE ng/mL (ref <50)
Lorazepam: NEGATIVE ng/mL (ref <50)
MDMA: NEGATIVE ng/mL (ref <500)
Marijuana Metabolite: NEGATIVE ng/mL (ref <20)
Methamphetamine: NEGATIVE ng/mL (ref <250)
Nordiazepam: NEGATIVE ng/mL (ref <50)
Opiates: NEGATIVE ng/mL (ref <100)
Oxazepam: 276 ng/mL — ABNORMAL HIGH (ref <50)
Oxidant: NEGATIVE ug/mL (ref <200)
Oxycodone: NEGATIVE ng/mL (ref <100)
Temazepam: >6250 ng/mL — ABNORMAL HIGH (ref <50)
pH: 6.6 (ref 4.5–9.0)

## 2022-01-16 LAB — DM TEMPLATE

## 2022-01-19 ENCOUNTER — Other Ambulatory Visit (HOSPITAL_COMMUNITY): Payer: Self-pay

## 2022-01-19 ENCOUNTER — Encounter: Payer: Self-pay | Admitting: Family Medicine

## 2022-01-19 ENCOUNTER — Other Ambulatory Visit: Payer: Self-pay | Admitting: Family Medicine

## 2022-01-19 MED ORDER — TEMAZEPAM 22.5 MG PO CAPS
22.5000 mg | ORAL_CAPSULE | Freq: Every evening | ORAL | 0 refills | Status: DC | PRN
  Filled 2022-01-19: qty 15, 15d supply, fill #0

## 2022-01-19 MED ORDER — TEMAZEPAM 15 MG PO CAPS
15.0000 mg | ORAL_CAPSULE | Freq: Every evening | ORAL | 0 refills | Status: DC | PRN
  Filled 2022-01-19: qty 15, 15d supply, fill #0

## 2022-01-19 MED ORDER — HYDROXYZINE HCL 10 MG PO TABS
10.0000 mg | ORAL_TABLET | Freq: Every evening | ORAL | 1 refills | Status: DC
  Filled 2022-01-19: qty 30, 30d supply, fill #0

## 2022-01-19 NOTE — Progress Notes (Signed)
Changing temazepam

## 2022-01-20 ENCOUNTER — Other Ambulatory Visit (HOSPITAL_COMMUNITY): Payer: Self-pay

## 2022-01-30 ENCOUNTER — Other Ambulatory Visit (HOSPITAL_COMMUNITY): Payer: Self-pay

## 2022-02-12 ENCOUNTER — Encounter: Payer: Self-pay | Admitting: Family Medicine

## 2022-02-12 ENCOUNTER — Telehealth: Payer: 59 | Admitting: Family Medicine

## 2022-02-12 DIAGNOSIS — J302 Other seasonal allergic rhinitis: Secondary | ICD-10-CM

## 2022-02-12 DIAGNOSIS — E6609 Other obesity due to excess calories: Secondary | ICD-10-CM | POA: Diagnosis not present

## 2022-02-12 DIAGNOSIS — R41840 Attention and concentration deficit: Secondary | ICD-10-CM

## 2022-02-12 DIAGNOSIS — E538 Deficiency of other specified B group vitamins: Secondary | ICD-10-CM

## 2022-02-12 DIAGNOSIS — E282 Polycystic ovarian syndrome: Secondary | ICD-10-CM | POA: Diagnosis not present

## 2022-02-12 DIAGNOSIS — E119 Type 2 diabetes mellitus without complications: Secondary | ICD-10-CM

## 2022-02-12 DIAGNOSIS — G43009 Migraine without aura, not intractable, without status migrainosus: Secondary | ICD-10-CM

## 2022-02-12 DIAGNOSIS — Z6834 Body mass index (BMI) 34.0-34.9, adult: Secondary | ICD-10-CM | POA: Diagnosis not present

## 2022-02-12 DIAGNOSIS — F5101 Primary insomnia: Secondary | ICD-10-CM

## 2022-02-12 MED ORDER — OZEMPIC (0.25 OR 0.5 MG/DOSE) 2 MG/1.5ML ~~LOC~~ SOPN
PEN_INJECTOR | SUBCUTANEOUS | 1 refills | Status: DC
  Filled 2022-02-12: qty 1.5, 42d supply, fill #0

## 2022-02-12 NOTE — Patient Instructions (Signed)
It was very nice to see you today!  Will add ozempic weekly.  Dose needs to chang monthly, so call for next dose. Take fish oil 2000mg /day   PLEASE NOTE:  If you had any lab tests please let us know if you have not heard back within a few days. You may see your results on MyChart before we have a chance to review them but we will give you a call once they are reviewed by [place]. If we ordered any referrals today, please let us know if you have not heard from their office within the next week.   Please try these tips to maintain a healthy lifestyle:  Eat most of your calories during the day when you are active. Eliminate processed foods including packaged sweets (pies, cakes, cookies), reduce intake of potatoes, white bread, white pasta, and white rice. Look for whole grain options, oat flour or almond flour.  Each meal should contain half fruits/vegetables, one quarter protein, and one quarter carbs (no bigger than a computer mouse).  Cut down on sweet beverages. This includes juice, soda, and sweet tea. Also watch fruit intake, though this is a healthier sweet option, it still contains natural sugar! Limit to 3 servings daily.  Drink at least 1 glass of water with each meal and aim for at least 8 glasses per day  Exercise at least 150 minutes every week.

## 2022-02-12 NOTE — Progress Notes (Signed)
MyChart Video Visit    Virtual Visit via Video Note   This visit type was conducted due to national recommendations for restrictions regarding the COVID-19 Pandemic (e.g. social distancing) in an effort to limit this patient's exposure and mitigate transmission in our community. This patient is at least at moderate risk for complications without adequate follow up. This format is felt to be most appropriate for this patient at this time. Physical exam was limited by quality of the video and audio technology used for the visit. CMA was able to get the patient set up on a video visit.  Patient location: Home. Patient and provider in visit Provider location: Office  I discussed the limitations of evaluation and management by telemedicine and the availability of in person appointments. The patient expressed understanding and agreed to proceed.  Visit Date: 02/12/2022  Today's healthcare provider: Wellington Hampshire, MD     Subjective:    Patient ID: Daisy Ramirez, female    DOB: 1971-01-03, 51 y.o.   MRN: [id number]  Chief Complaint  Patient presents with   Follow-up    Thyroid and medication management.     HPI  Concentration difficulties-has weaned down to Adderall 15mg  in am-doing ok x more tired Insomnia-Temazepam 15 mg prn now.  Sleeping fair. Migraine-Propranol-weaning-no change in HA so will stop Allergies and sinuses flaring-will get back on meds-allegra/flonase preDM now DM-on metformin and working on TLC, but not losing wt.  B12 deficiency-taking that and D PCOS-on aldactone for hair growth.  On metformin for insulin resistance Obesity-workin on diet/exercise.  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   Sinusitis 09/05/2015   Synovitis of right knee 11/11/2017   UTI (lower urinary tract infection)     Past Surgical History:  Procedure Laterality Date   ABDOMINAL HYSTERECTOMY  11/16/2012   Procedure: HYSTERECTOMY ABDOMINAL;  Surgeon: Cyril Mourning, MD;  Location: [facility];  Service: Gynecology;  Laterality: N/A;   bilateral knee scope  1997   CHONDROPLASTY Right 11/16/2017   Procedure: CHONDROPLASTY;  Surgeon: Elsie Saas, MD;  Location: [place];  Service: Orthopedics;  Laterality: Right;   KNEE ARTHROSCOPY WITH LATERAL MENISECTOMY Right 11/16/2017   Procedure: ARTHROSCOPY RIGHT KNEE WITH SYNOVECTOMY, CHONDROPLASTY, PARTIAL LATERAL MENISECTOMY;  Surgeon: Elsie Saas, MD;  Location: [place];  Service: Orthopedics;  Laterality: Right;   KNEE SURGERY     x 2   KNEE SURGERY     bilateral   SALPINGOOPHORECTOMY  11/16/2012   Procedure: SALPINGO OOPHORECTOMY;  Surgeon: Cyril Mourning, MD;  Location: [facility];  Service: Gynecology;  Laterality: Bilateral;    Outpatient Medications Prior to Visit  Medication Sig Dispense Refill   levothyroxine (SYNTHROID) 137 MCG tablet Take 1 tablet by mouth daily 90 tablet 3  metFORMIN (GLUCOPHAGE-XR) 500 MG 24 hr tablet TAKE 1 TABLET BY MOUTH ONCE A DAY 90 tablet 3   spironolactone (ALDACTONE) 100 MG tablet Take 100 mg daily by mouth.     vitamin B-12 (CYANOCOBALAMIN) 500 MCG tablet Take 500 mcg by mouth daily.     amphetamine-dextroamphetamine (ADDERALL) 30 MG tablet TAKE 2 TABLETS BY MOUTH ONCE A DAY 60 tablet 0   hydrOXYzine (ATARAX) 10 MG tablet Take 1 tablet (10 mg  total) by mouth at bedtime. (Patient not taking: Reported on 02/12/2022) 30 tablet 1   temazepam (RESTORIL) 15 MG capsule Take 1 capsule (15 mg total) by mouth at bedtime as needed for sleep. (Patient not taking: Reported on 02/12/2022) 15 capsule 0   influenza vaccine (FLUCELVAX QUADRIVALENT) 0.5 ML injection Flucelvax Quad 2020-2021 (PF) 60 mcg (15 mcg x 4)/0.5 mL IM syringe  ADM 0.5ML IM UTD     propranolol ER (INDERAL LA) 120 MG 24 hr capsule TAKE 1 CAPSULE BY MOUTH ONCE A DAY 90 capsule 0   temazepam (RESTORIL) 22.5 MG capsule Take 1 capsule (22.5 mg total) by mouth at bedtime as needed for sleep. (Patient not taking: Reported on 02/12/2022) 15 capsule 0   No facility-administered medications prior to visit.    No Known Allergies      Objective:     Physical Exam Vitals and nursing note reviewed.  Constitutional:      General:  is not in acute distress.    Appearance: Normal appearance.  HENT:     Head: Normocephalic.  Pulmonary:     Effort: No respiratory distress.  Musculoskeletal:     Cervical back: Normal range of motion.  Skin:    General: Skin is dry.     Coloration: Skin is not pale.  Neurological:     Mental Status: Pt is alert and oriented to person, place, and time.  Psychiatric:        Mood and Affect: Mood normal.   LMP 10/20/2012   Wt Readings from Last 3 Encounters:  01/14/22 218 lb 2 oz (98.9 kg)  11/16/17 204 lb (92.5 kg)  11/10/16 210 lb (95.3 kg)       Assessment & Plan:   Problem List Items Addressed This Visit       Cardiovascular and Mediastinum   Migraine     Respiratory   Allergic rhinitis     Endocrine   Type 2 diabetes mellitus without complication, without long-term current use of insulin (HCC)   PCOS (polycystic ovarian syndrome)     Other   Obesity   Primary insomnia   Vitamin B12 deficiency   Other Visit Diagnoses     Lack of concentration    -  Primary     1.  Lack of concentration-multifactorial.  Patient is  weaning off of Adderall due to side effects.  So far doing okay.  Her goals are to get off of it. 2.  Insomnia-doing better as she is weaning Adderall and is more fatigued.  Still taking temazepam 15 mg occasionally.  She will call when she needs refills.  Advised she can also increase the hydroxyzine to 20 to 30 mg. 3.  Migraine-no change on or off of the propranolol.  She will stop taking it. 4.  Allergic rhinitis-she will get back on Allegra and Flonase.  Season is starting up. 5.  Glucose impaired-technically now diabetes type 2 (A1c 6.6) she is already on metformin and working on diet/exercise.  Willing to start [place]. 6.  Vitamin B12 deficiency-taking supplements. 7.  PCOS-taking spironolactone for excessive facial hair.  We will continue for now 8.  Obesity-now with type 2 diabetes-needs to get diet under control.  She has been unable to lose weight despite high-dose Adderall and phentermine.  Will start Ozempic  Follow-up in 3 months  No orders of the defined types were placed in this encounter.   I discussed the assessment and treatment plan with the patient. The patient was provided an opportunity to ask questions and all were answered. The patient agreed with the plan and demonstrated an understanding of the instructions.   The patient was advised to call back or seek an in-person evaluation if the symptoms worsen or if the condition fails to improve as anticipated.  I provided 30 minutes of face-to-face time during this encounter.   Wellington Hampshire, MD [place] [phone] (phone) [phone] (fax)  [place]

## 2022-02-13 ENCOUNTER — Other Ambulatory Visit (HOSPITAL_COMMUNITY): Payer: Self-pay

## 2022-03-31 ENCOUNTER — Encounter: Payer: Self-pay | Admitting: Family Medicine

## 2022-03-31 ENCOUNTER — Other Ambulatory Visit (HOSPITAL_BASED_OUTPATIENT_CLINIC_OR_DEPARTMENT_OTHER): Payer: Self-pay

## 2022-03-31 ENCOUNTER — Telehealth (INDEPENDENT_AMBULATORY_CARE_PROVIDER_SITE_OTHER): Payer: 59 | Admitting: Family Medicine

## 2022-03-31 ENCOUNTER — Other Ambulatory Visit (HOSPITAL_COMMUNITY): Payer: Self-pay

## 2022-03-31 VITALS — Temp 101.0°F

## 2022-03-31 DIAGNOSIS — U071 COVID-19: Secondary | ICD-10-CM | POA: Diagnosis not present

## 2022-03-31 DIAGNOSIS — J0100 Acute maxillary sinusitis, unspecified: Secondary | ICD-10-CM | POA: Diagnosis not present

## 2022-03-31 MED ORDER — PREDNISONE 20 MG PO TABS
40.0000 mg | ORAL_TABLET | Freq: Every day | ORAL | 0 refills | Status: AC
  Filled 2022-03-31: qty 10, 5d supply, fill #0

## 2022-03-31 MED ORDER — AMOXICILLIN-POT CLAVULANATE 875-125 MG PO TABS
1.0000 | ORAL_TABLET | Freq: Two times a day (BID) | ORAL | 0 refills | Status: DC
  Filled 2022-03-31: qty 20, 10d supply, fill #0

## 2022-03-31 MED ORDER — NIRMATRELVIR/RITONAVIR (PAXLOVID)TABLET
3.0000 | ORAL_TABLET | Freq: Two times a day (BID) | ORAL | 0 refills | Status: AC
  Filled 2022-03-31: qty 30, 5d supply, fill #0

## 2022-03-31 MED ORDER — METFORMIN HCL ER 500 MG PO TB24
ORAL_TABLET | Freq: Every day | ORAL | 3 refills | Status: DC
  Filled 2022-03-31: qty 90, 90d supply, fill #0

## 2022-03-31 NOTE — Progress Notes (Addendum)
? ? ?MyChart Video Visit ? ? ? ?Virtual Visit via Video Note  ? ?This visit type was conducted due to national recommendations for restrictions regarding the COVID-19 Pandemic (e.g. social distancing) in an effort to limit this patient's exposure and mitigate transmission in our community. This patient is at least at moderate risk for complications without adequate follow up. This format is felt to be most appropriate for this patient at this time. Physical exam was limited by quality of the video and audio technology used for the visit. CMA was able to get the patient set up on a video visit. ? ?Patient location: Home. Patient and provider in visit ?Provider location: Office ? ?I discussed the limitations of evaluation and management by telemedicine and the availability of in person appointments. The patient expressed understanding and agreed to proceed. ? ?Visit Date: 03/31/2022 ? ?Today's healthcare provider: Wellington Hampshire, MD  ? ? ? ?Subjective:  ? ? Patient ID: Daisy Ramirez, female    DOB: August 11, 1971, 51 y.o.   MRN: [id number] ? ?Chief Complaint  ?Patient presents with  ? Sore Throat  ? Fever  ?  TEMP OF 102 last night ?Took tylenol   ? Ear Pain  ? Sinus Problem  ?  Sinuses swollen and painful ?Has taking Mucinex, Allegra and flonase with no relief  ? ? ?HPI ?Chief complaint: sore throat ?Symptom onset: 2 wks. ?Pertinent positives: temp 102 last pm, ear pain B since last pm, sinuses swollen and pain for 2 wks, sore throat since last pm swollen glands since last pm.  B ear pain ?Pertinent negatives: didn't check for covid ?Treatments tried: OTC ?Vaccine status:  ?Sick exposure: none  ? ?Past Medical History:  ?Diagnosis Date  ? Allergy   ? Arthritis   ? 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 Asthma   ? no inhaler  ? Chronic patellofemoral pain of right knee 11/11/2017  ? Hashimoto's thyroiditis   ? Headache(784.0)   ? Hypertension   ? Hypothyroidism   ?  Migraines   ? Obesity 09/05/2015  ? PCOS (polycystic ovarian syndrome) 2010  ? followed by Dr. Candie Mile  ? S/P total hysterectomy 09/05/2015  ? H/o fibroids, ovarian cysts, endometriosis and dysmenorrhea  TAH with Dr Tressia Danas, November of 2013  ? 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 Sinusitis 09/05/2015  ? Synovitis of right knee 11/11/2017  ? UTI (lower urinary tract infection)   ? ? ?Past Surgical History:  ?Procedure Laterality Date  ? ABDOMINAL HYSTERECTOMY  11/16/2012  ? Procedure: HYSTERECTOMY ABDOMINAL;  Surgeon: Cyril Mourning, MD;  Location: [facility];  Service: Gynecology;  Laterality: N/A;  ? bilateral knee scope  1997  ? CHONDROPLASTY Right 11/16/2017  ? Procedure: CHONDROPLASTY;  Surgeon: Elsie Saas, MD;  Location: [place];  Service: Orthopedics;  Laterality: Right;  ? KNEE ARTHROSCOPY WITH LATERAL MENISECTOMY Right 11/16/2017  ? Procedure: ARTHROSCOPY RIGHT KNEE WITH SYNOVECTOMY, CHONDROPLASTY, PARTIAL LATERAL MENISECTOMY;  Surgeon: Elsie Saas, MD;  Location: [place];  Service: Orthopedics;  Laterality: Right;  ? KNEE SURGERY    ? x 2  ? KNEE SURGERY    ? bilateral  ? SALPINGOOPHORECTOMY  11/16/2012  ? Procedure: SALPINGO OOPHORECTOMY;  Surgeon: Cyril Mourning, MD;  Location: [facility];  Service: Gynecology;  Laterality: Bilateral;  ? ? ?Outpatient Medications Prior to Visit  ?Medication Sig Dispense Refill  ? levothyroxine (  SYNTHROID) 137 MCG tablet Take 1 tablet by mouth daily 90 tablet 3  ? spironolactone (ALDACTONE) 100 MG tablet Take 100 mg daily by mouth.    ? vitamin B-12 (CYANOCOBALAMIN) 500 MCG tablet Take 500 mcg by mouth daily.    ? amphetamine-dextroamphetamine (ADDERALL) 30 MG tablet TAKE 2 TABLETS BY MOUTH ONCE A DAY 60 tablet 0  ? hydrOXYzine (ATARAX) 10 MG tablet Take 1 tablet (10 mg total) by mouth at bedtime. (Patient not taking: Reported on 02/12/2022) 30 tablet 1  ? metFORMIN (GLUCOPHAGE-XR) 500 MG 24 hr tablet  TAKE 1 TABLET BY MOUTH ONCE A DAY 90 tablet 3  ? Semaglutide,0.25 or 0.'5MG'$ /DOS, (OZEMPIC, 0.25 OR 0.5 MG/DOSE,) 2 MG/1.5ML SOPN Inject 0.25 mg into the skin once a week for 28 days, THEN 0.5 mg once a week for 28 days. (Patient not taking: Reported on 03/31/2022) 2 mL 1  ? temazepam (RESTORIL) 15 MG capsule Take 1 capsule (15 mg total) by mouth at bedtime as needed for sleep. (Patient not taking: Reported on 02/12/2022) 15 capsule 0  ? ?No facility-administered medications prior to visit.  ? ? ?No Known Allergies ? ?Needs refill metformin.  Stopped ozempic-didn't feel good on it and urine very dark ? ? ?   ?Objective:  ?  ? ?Physical Exam  ?Vitals and nursing note reviewed.  ?Constitutional:   ?   General:  is not in acute distress. But mod ill appearing ?   Appearance: Normal appearance.  ?HENT:  ?   Head: Normocephalic.  ?Pulmonary:  ?   Effort: No respiratory distress.  ?Skin: ?   General: Skin is dry.  ?   Coloration: Skin is not pale.  ?Neurological:  ?   Mental Status: Pt is alert and oriented to person, place, and time.  ?Psychiatric:     ?   Mood and Affect: Mood normal.  ? ?Temp (!) 101 ?F (38.3 ?C) (Oral)   LMP 10/20/2012  ? ?Wt Readings from Last 3 Encounters:  ?01/14/22 218 lb 2 oz (98.9 kg)  ?11/16/17 204 lb (92.5 kg)  ?11/10/16 210 lb (95.3 kg)  ? ? ?   ?Assessment & Plan:  ? ?Problem List Items Addressed This Visit   ? ?  ? Respiratory  ? Sinusitis - Primary  ? Relevant Medications  ? amoxicillin-clavulanate (AUGMENTIN) 875-125 MG tablet  ? predniSONE (DELTASONE) 20 MG tablet  ? Sinusitis-augmentin and pred.  Pt has gotten high fevers w/sinusitis in past.  Advised may have another process occurrine(ie covid, strep, other).  If not improving 1-2 days, check for covid ?2.  Covid 19-later, pt tested + for covid ? ?Meds ordered this encounter  ?Medications  ? metFORMIN (GLUCOPHAGE-XR) 500 MG 24 hr tablet  ?  Sig: TAKE 1 TABLET BY MOUTH ONCE A DAY  ?  Dispense:  90 tablet  ?  Refill:  3  ?  amoxicillin-clavulanate (AUGMENTIN) 875-125 MG tablet  ?  Sig: Take 1 tablet by mouth 2 (two) times daily.  ?  Dispense:  20 tablet  ?  Refill:  0  ? predniSONE (DELTASONE) 20 MG tablet  ?  Sig: Take 2 tablets (40 mg total) by mouth daily with breakfast for 5 days.  ?  Dispense:  10 tablet  ?  Refill:  0  ? ? ?I discussed the assessment and treatment plan with the patient. The patient was provided an opportunity to ask questions and all were answered. The patient agreed with the plan and demonstrated an understanding  of the instructions. ?  ?The patient was advised to call back or seek an in-person evaluation if the symptoms worsen or if the condition fails to improve as anticipated. ? ?I provided 15 minutes of face-to-face time during this encounter. ? ? ?Wellington Hampshire, MD ?[place] ?[phone] (phone) ?[phone] (fax) ? ?[facility]  ?

## 2022-03-31 NOTE — Addendum Note (Signed)
Addended by: Wellington Hampshire on: 03/31/2022 04:08 PM ? ? Modules accepted: Orders ? ?

## 2022-04-23 ENCOUNTER — Other Ambulatory Visit (HOSPITAL_COMMUNITY): Payer: Self-pay

## 2022-05-12 ENCOUNTER — Encounter: Payer: Self-pay | Admitting: Family Medicine

## 2022-05-12 ENCOUNTER — Other Ambulatory Visit (HOSPITAL_BASED_OUTPATIENT_CLINIC_OR_DEPARTMENT_OTHER): Payer: Self-pay

## 2022-05-12 ENCOUNTER — Ambulatory Visit (INDEPENDENT_AMBULATORY_CARE_PROVIDER_SITE_OTHER): Payer: 59 | Admitting: Family Medicine

## 2022-05-12 VITALS — BP 142/84 | HR 73 | Temp 98.1°F | Ht 67.0 in | Wt 224.4 lb

## 2022-05-12 DIAGNOSIS — E063 Autoimmune thyroiditis: Secondary | ICD-10-CM | POA: Diagnosis not present

## 2022-05-12 DIAGNOSIS — J0100 Acute maxillary sinusitis, unspecified: Secondary | ICD-10-CM

## 2022-05-12 DIAGNOSIS — E559 Vitamin D deficiency, unspecified: Secondary | ICD-10-CM

## 2022-05-12 DIAGNOSIS — G4489 Other headache syndrome: Secondary | ICD-10-CM | POA: Diagnosis not present

## 2022-05-12 DIAGNOSIS — E119 Type 2 diabetes mellitus without complications: Secondary | ICD-10-CM

## 2022-05-12 DIAGNOSIS — R0683 Snoring: Secondary | ICD-10-CM

## 2022-05-12 DIAGNOSIS — E538 Deficiency of other specified B group vitamins: Secondary | ICD-10-CM | POA: Diagnosis not present

## 2022-05-12 LAB — CBC WITH DIFFERENTIAL/PLATELET
Basophils Absolute: 0 10*3/uL (ref 0.0–0.1)
Basophils Relative: 0.5 % (ref 0.0–3.0)
Eosinophils Absolute: 0.1 10*3/uL (ref 0.0–0.7)
Eosinophils Relative: 2 % (ref 0.0–5.0)
HCT: 43.4 % (ref 36.0–46.0)
Hemoglobin: 14.5 g/dL (ref 12.0–15.0)
Lymphocytes Relative: 30.8 % (ref 12.0–46.0)
Lymphs Abs: 1.7 10*3/uL (ref 0.7–4.0)
MCHC: 33.5 g/dL (ref 30.0–36.0)
MCV: 89.1 fl (ref 78.0–100.0)
Monocytes Absolute: 0.4 10*3/uL (ref 0.1–1.0)
Monocytes Relative: 6.6 % (ref 3.0–12.0)
Neutro Abs: 3.4 10*3/uL (ref 1.4–7.7)
Neutrophils Relative %: 60.1 % (ref 43.0–77.0)
Platelets: 286 10*3/uL (ref 150.0–400.0)
RBC: 4.87 Mil/uL (ref 3.87–5.11)
RDW: 13.4 % (ref 11.5–15.5)
WBC: 5.7 10*3/uL (ref 4.0–10.5)

## 2022-05-12 LAB — COMPREHENSIVE METABOLIC PANEL
ALT: 41 U/L — ABNORMAL HIGH (ref 0–35)
AST: 25 U/L (ref 0–37)
Albumin: 4.8 g/dL (ref 3.5–5.2)
Alkaline Phosphatase: 99 U/L (ref 39–117)
BUN: 13 mg/dL (ref 6–23)
CO2: 26 mEq/L (ref 19–32)
Calcium: 10.2 mg/dL (ref 8.4–10.5)
Chloride: 103 mEq/L (ref 96–112)
Creatinine, Ser: 0.75 mg/dL (ref 0.40–1.20)
GFR: 92.71 mL/min (ref >60.00)
Glucose, Bld: 102 mg/dL — ABNORMAL HIGH (ref 70–99)
Potassium: 4.5 mEq/L (ref 3.5–5.1)
Sodium: 139 mEq/L (ref 135–145)
Total Bilirubin: 0.5 mg/dL (ref 0.2–1.2)
Total Protein: 7.3 g/dL (ref 6.0–8.3)

## 2022-05-12 LAB — VITAMIN B12: Vitamin B-12: 663 pg/mL (ref 211–911)

## 2022-05-12 LAB — MICROALBUMIN / CREATININE URINE RATIO
Creatinine,U: 45.9 mg/dL
Microalb Creat Ratio: 1.5 mg/g (ref 0.0–30.0)
Microalb, Ur: <0.7 mg/dL (ref 0.0–1.9)

## 2022-05-12 LAB — HEMOGLOBIN A1C: Hgb A1c MFr Bld: 6.4 % (ref 4.6–6.5)

## 2022-05-12 LAB — LIPID PANEL
Cholesterol: 271 mg/dL — ABNORMAL HIGH (ref 0–200)
HDL: 46.1 mg/dL (ref >39.00)
NonHDL: 224.74
Total CHOL/HDL Ratio: 6
Triglycerides: 236 mg/dL — ABNORMAL HIGH (ref 0.0–149.0)
VLDL: 47.2 mg/dL — ABNORMAL HIGH (ref 0.0–40.0)

## 2022-05-12 LAB — T4, FREE: Free T4: 0.88 ng/dL (ref 0.60–1.60)

## 2022-05-12 LAB — T3, FREE: T3, Free: 3.2 pg/mL (ref 2.3–4.2)

## 2022-05-12 LAB — TSH: TSH: 2.35 u[IU]/mL (ref 0.35–5.50)

## 2022-05-12 LAB — LDL CHOLESTEROL, DIRECT: Direct LDL: 193 mg/dL

## 2022-05-12 LAB — VITAMIN D 25 HYDROXY (VIT D DEFICIENCY, FRACTURES): VITD: 32.11 ng/mL (ref 30.00–100.00)

## 2022-05-12 MED ORDER — CEFDINIR 300 MG PO CAPS
300.0000 mg | ORAL_CAPSULE | Freq: Two times a day (BID) | ORAL | 0 refills | Status: DC
  Filled 2022-05-12: qty 20, 10d supply, fill #0

## 2022-05-12 MED ORDER — PREDNISONE 20 MG PO TABS
40.0000 mg | ORAL_TABLET | Freq: Every day | ORAL | 0 refills | Status: AC
  Filled 2022-05-12: qty 10, 5d supply, fill #0

## 2022-05-12 NOTE — Progress Notes (Signed)
? ?Subjective:  ? ? ? Patient ID: Daisy Ramirez, female    DOB: 1971/04/03, 51 y.o.   MRN: [id number] ? ?Chief Complaint  ?Patient presents with  ? Follow-up  ?  3 month follow-up on thyroid and dm ?Fasting ?  ? ? ?HPI ? Hypothyroidism-taking meds regularly. Had changed to levothyroxine so need to reck.  No tremors ?DM type 2-not on metformin since covid.  Working on diet.  But exercise dropped down since covid-phantom smells of smoke, ha, ear pain.  Ozempic caused back and kidney pain.  ?B12 deficiency-taking regularly.  ? ?Health Maintenance Due  ?Topic Date Due  ? FOOT EXAM  Never done  ? OPHTHALMOLOGY EXAM  Never done  ? URINE MICROALBUMIN  Never done  ? Zoster Vaccines- Shingrix (1 of 2) Never done  ? TETANUS/TDAP  01/07/2009  ? COLONOSCOPY (Pts 45-24yr Insurance coverage will need to be confirmed)  Never done  ? MAMMOGRAM  Never done  ? ? ?Past Medical History:  ?Diagnosis Date  ? Allergy   ? Arthritis   ? 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 Asthma   ? no inhaler  ? Chronic patellofemoral pain of right knee 11/11/2017  ? Hashimoto's thyroiditis   ? Headache(784.0)   ? Hypertension   ? Hypothyroidism   ? Migraines   ? Obesity 09/05/2015  ? PCOS (polycystic ovarian syndrome) 2010  ? followed by Dr. GCandie Mile ? S/P total hysterectomy 09/05/2015  ? H/o fibroids, ovarian cysts, endometriosis and dysmenorrhea  TAH with Dr GTressia Danas November of 2013  ? 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 Sinusitis 09/05/2015  ? Synovitis of right knee 11/11/2017  ? UTI (lower urinary tract infection)   ? ? ?Past Surgical History:  ?Procedure Laterality Date  ? ABDOMINAL HYSTERECTOMY  11/16/2012  ? Procedure: HYSTERECTOMY ABDOMINAL;  Surgeon: MCyril Mourning MD;  [facility];  Service: Gynecology;  Laterality: N/A;  ? bilateral knee scope  1997  ? CHONDROPLASTY Right 11/16/2017  ? Procedure: CHONDROPLASTY;  Surgeon: WElsie Saas MD;   Location: [place]  Service: Orthopedics;  Laterality: Right;  ? KNEE ARTHROSCOPY WITH LATERAL MENISECTOMY Right 11/16/2017  ? Procedure: ARTHROSCOPY RIGHT KNEE WITH SYNOVECTOMY, CHONDROPLASTY, PARTIAL LATERAL MENISECTOMY;  Surgeon: WElsie Saas MD;  Location: [place]  Service: Orthopedics;  Laterality: Right;  ? KNEE SURGERY    ? x 2  ? KNEE SURGERY    ? bilateral  ? SALPINGOOPHORECTOMY  11/16/2012  ? Procedure: SALPINGO OOPHORECTOMY;  Surgeon: MCyril Mourning MD;  [facility];  Service: Gynecology;  Laterality: Bilateral;  ? ? ?Outpatient Medications Prior to Visit  ?Medication Sig Dispense Refill  ? hydrOXYzine (ATARAX) 10 MG tablet Take 1 tablet (10 mg total) by mouth at bedtime. 30 tablet 1  ? levothyroxine (SYNTHROID) 137 MCG tablet Take 1 tablet by mouth daily 90 tablet 3  ? metFORMIN (GLUCOPHAGE-XR) 500 MG 24 hr tablet TAKE 1 TABLET BY MOUTH ONCE A DAY 90 tablet 3  ? spironolactone (ALDACTONE) 100 MG tablet Take 100 mg daily by mouth.    ? vitamin B-12 (CYANOCOBALAMIN) 500 MCG tablet Take 500 mcg by mouth daily.    ? amoxicillin-clavulanate (AUGMENTIN) 875-125 MG tablet Take 1 tablet by mouth 2 (two) times daily. (Patient not taking: Reported on 05/12/2022) 20 tablet 0  ? amphetamine-dextroamphetamine (ADDERALL) 30 MG tablet TAKE 2 TABLETS BY MOUTH ONCE  A DAY 60 tablet 0  ? ?No facility-administered medications prior to visit.  ? ? ?No Known Allergies ?ROS neg/noncontributory except as noted HPI/below ?Had covid-ears still clogged and sinus pressure. Snoring very badly-so husb sleeping in another room.  But has had for sev months but worse since covid.  Will wake self up snorting. Tired a lot more HA esp R eye and top of head.  Took covid meds and augmentin ? ?No cp/cough/sob/n/v/d ? ?Not hot since off adderall and phentermine ? ? ?   ?Objective:  ?  ? ?BP (!) 142/84   Pulse 73   Temp 98.1 ?F (36.7 ?C) (Temporal)   Ht '5\' 7"'$  (1.702 m)   Wt 224 lb 6 oz (101.8  kg)   LMP 10/20/2012   SpO2 98%   BMI 35.14 kg/m?  ?Wt Readings from Last 3 Encounters:  ?05/12/22 224 lb 6 oz (101.8 kg)  ?01/14/22 218 lb 2 oz (98.9 kg)  ?11/16/17 204 lb (92.5 kg)  ? ? ?Physical Exam  ? ?Gen: WDWN NAD [facility] ?HEENT: NCAT, conjunctiva not injected, sclera nonicteric.  EOMI ?TM bulging B, OP moist, no exudates but very crowded  ?NECK:  supple, no thyromegaly, +submand nodes, no carotid bruits ?CARDIAC: RRR, S1S2+, no murmur. DP 2+B ?LUNGS: CTAB. No wheezes ?ABDOMEN:  BS+, soft,mildly-diffusely tender. No HSM, no masses ?EXT:  no edema ?MSK: no gross abnormalities.  ?NEURO: A&O x3.  CN II-XII intact.  ?PSYCH: normal mood. Good eye contact ? ?   ?Assessment & Plan:  ? ?Problem List Items Addressed This Visit   ? ?  ? Respiratory  ? Sinusitis - Primary  ? Relevant Medications  ? predniSONE (DELTASONE) 20 MG tablet  ? cefdinir (OMNICEF) 300 MG capsule  ?  ? Endocrine  ? HASHIMOTO'S THYROIDITIS  ? Relevant Orders  ? TSH  ? T3, free  ? T4, free  ? Type 2 diabetes mellitus without complication, without long-term current use of insulin ([place])  ? Relevant Orders  ? Comprehensive metabolic panel  ? Hemoglobin A1c  ? Lipid panel  ? TSH  ? T3, free  ? T4, free  ? VITAMIN D 25 Hydroxy (Vit-D Deficiency, Fractures)  ? Vitamin B12  ? CBC with Differential/Platelet  ? Microalbumin / creatinine urine ratio  ?  ? Other  ? Vitamin B12 deficiency  ? Relevant Orders  ? Vitamin B12  ? ?Other Visit Diagnoses   ? ? Other headache syndrome      ? Relevant Orders  ? MR Brain Wo Contrast  ? Vitamin D deficiency      ? Relevant Orders  ? VITAMIN D 25 Hydroxy (Vit-D Deficiency, Fractures)  ? Snoring      ? Relevant Orders  ? Ambulatory referral to ENT  ? ?  ? Sinusitis-long h/o.  Augmentin didn't help.  Will do omnicef '300mg'$  bid and pred '40mg'$ .  Call w/progress ?B12 deficiency-on supps.  Check levels ?Hashimoto's thyroiditis-chronic/stable. on Levothyroxine 137-need to check TSH,FT3,FT4 as changed to levothyroxine from  arm[facility]. ?HA-new.  May be post covid, sinusitis, sleep apnea. Other.  Check MRI as new/change.  Consider sleep study ?DM type 2-chronic/controlled.  Pt working on TLC.  Not on metformin d/t covid illness and too many meds then.  Check lipids,cmp,A1C, microalb/creat ratio.  May be able to cont TLC.  SE to ozempic so if not controlled, metformin. ?Snoring-saw video.  Crowded OP.  Refer ENT.   ?Vit D deficiency-chronic.  On supps-check vit D ?F/u 3 mo ? ?  Meds ordered this encounter  ?Medications  ? predniSONE (DELTASONE) 20 MG tablet  ?  Sig: Take 2 tablets (40 mg total) by mouth daily with breakfast for 5 days.  ?  Dispense:  10 tablet  ?  Refill:  0  ? cefdinir (OMNICEF) 300 MG capsule  ?  Sig: Take 1 capsule (300 mg total) by mouth 2 (two) times daily.  ?  Dispense:  20 capsule  ?  Refill:  0  ? ? ?Wellington Hampshire, MD ? ?

## 2022-05-12 NOTE — Patient Instructions (Signed)
It was very nice to see you today! ? ?Check blood pressures few times/wk ?Scheduling MRI and ENT ? ? ?PLEASE NOTE: ? ?If you had any lab tests please let us know if you have not heard back within a few days. You may see your results on MyChart before we have a chance to review them but we will give you a call once they are reviewed by [place]. If we ordered any referrals today, please let us know if you have not heard from their office within the next week.  ? ?Please try these tips to maintain a healthy lifestyle: ? ?Eat most of your calories during the day when you are active. Eliminate processed foods including packaged sweets (pies, cakes, cookies), reduce intake of potatoes, white bread, white pasta, and white rice. Look for whole grain options, oat flour or almond flour. ? ?Each meal should contain half fruits/vegetables, one quarter protein, and one quarter carbs (no bigger than a computer mouse). ? ?Cut down on sweet beverages. This includes juice, soda, and sweet tea. Also watch fruit intake, though this is a healthier sweet option, it still contains natural sugar! Limit to 3 servings daily. ? ?Drink at least 1 glass of water with each meal and aim for at least 8 glasses per day ? ?Exercise at least 150 minutes every week.   ?

## 2022-05-16 ENCOUNTER — Ambulatory Visit
Admission: RE | Admit: 2022-05-16 | Discharge: 2022-05-16 | Disposition: A | Discharge status: Home / Self Care | Payer: 59 | Source: Ambulatory Visit | Attending: Family Medicine | Admitting: Family Medicine

## 2022-05-16 DIAGNOSIS — G4489 Other headache syndrome: Secondary | ICD-10-CM

## 2022-05-16 DIAGNOSIS — R519 Headache, unspecified: Secondary | ICD-10-CM | POA: Diagnosis not present

## 2022-05-18 ENCOUNTER — Encounter: Payer: Self-pay | Admitting: Family Medicine

## 2022-05-22 DIAGNOSIS — Z6835 Body mass index (BMI) 35.0-35.9, adult: Secondary | ICD-10-CM | POA: Diagnosis not present

## 2022-05-22 DIAGNOSIS — Z01419 Encounter for gynecological examination (general) (routine) without abnormal findings: Secondary | ICD-10-CM | POA: Diagnosis not present

## 2022-05-22 DIAGNOSIS — Z1231 Encounter for screening mammogram for malignant neoplasm of breast: Secondary | ICD-10-CM | POA: Diagnosis not present

## 2022-07-30 ENCOUNTER — Other Ambulatory Visit (HOSPITAL_COMMUNITY): Payer: Self-pay

## 2022-08-03 ENCOUNTER — Other Ambulatory Visit (HOSPITAL_COMMUNITY): Payer: Self-pay

## 2022-08-04 ENCOUNTER — Other Ambulatory Visit (HOSPITAL_COMMUNITY): Payer: Self-pay

## 2022-08-05 ENCOUNTER — Other Ambulatory Visit (HOSPITAL_COMMUNITY): Payer: Self-pay

## 2022-08-12 ENCOUNTER — Encounter (HOSPITAL_BASED_OUTPATIENT_CLINIC_OR_DEPARTMENT_OTHER): Payer: Self-pay

## 2022-08-12 ENCOUNTER — Ambulatory Visit: Payer: 59 | Admitting: Family Medicine

## 2022-08-12 ENCOUNTER — Ambulatory Visit (HOSPITAL_BASED_OUTPATIENT_CLINIC_OR_DEPARTMENT_OTHER)
Admission: RE | Admit: 2022-08-12 | Discharge: 2022-08-12 | Disposition: A | Discharge status: Home / Self Care | Payer: 59 | Source: Ambulatory Visit | Attending: Family Medicine | Admitting: Family Medicine

## 2022-08-12 ENCOUNTER — Other Ambulatory Visit: Payer: Self-pay | Admitting: Family Medicine

## 2022-08-12 ENCOUNTER — Other Ambulatory Visit (HOSPITAL_COMMUNITY): Payer: Self-pay

## 2022-08-12 ENCOUNTER — Other Ambulatory Visit (HOSPITAL_BASED_OUTPATIENT_CLINIC_OR_DEPARTMENT_OTHER): Payer: Self-pay

## 2022-08-12 ENCOUNTER — Encounter: Payer: Self-pay | Admitting: Family Medicine

## 2022-08-12 VITALS — BP 124/82 | HR 81 | Temp 98.1°F | Ht 67.0 in | Wt 229.0 lb

## 2022-08-12 DIAGNOSIS — R1084 Generalized abdominal pain: Secondary | ICD-10-CM | POA: Diagnosis not present

## 2022-08-12 DIAGNOSIS — E119 Type 2 diabetes mellitus without complications: Secondary | ICD-10-CM

## 2022-08-12 DIAGNOSIS — K760 Fatty (change of) liver, not elsewhere classified: Secondary | ICD-10-CM | POA: Diagnosis not present

## 2022-08-12 DIAGNOSIS — E063 Autoimmune thyroiditis: Secondary | ICD-10-CM

## 2022-08-12 DIAGNOSIS — R109 Unspecified abdominal pain: Secondary | ICD-10-CM | POA: Diagnosis not present

## 2022-08-12 DIAGNOSIS — E538 Deficiency of other specified B group vitamins: Secondary | ICD-10-CM | POA: Diagnosis not present

## 2022-08-12 LAB — COMPREHENSIVE METABOLIC PANEL
ALT: 43 U/L — ABNORMAL HIGH (ref 0–35)
AST: 31 U/L (ref 0–37)
Albumin: 4.5 g/dL (ref 3.5–5.2)
Alkaline Phosphatase: 103 U/L (ref 39–117)
BUN: 11 mg/dL (ref 6–23)
CO2: 26 mEq/L (ref 19–32)
Calcium: 10.1 mg/dL (ref 8.4–10.5)
Chloride: 104 mEq/L (ref 96–112)
Creatinine, Ser: 0.75 mg/dL (ref 0.40–1.20)
GFR: 92.54 mL/min (ref >60.00)
Glucose, Bld: 107 mg/dL — ABNORMAL HIGH (ref 70–99)
Potassium: 4.3 mEq/L (ref 3.5–5.1)
Sodium: 140 mEq/L (ref 135–145)
Total Bilirubin: 0.4 mg/dL (ref 0.2–1.2)
Total Protein: 7.4 g/dL (ref 6.0–8.3)

## 2022-08-12 LAB — LIPID PANEL
Cholesterol: 255 mg/dL — ABNORMAL HIGH (ref 0–200)
HDL: 43.5 mg/dL (ref >39.00)
NonHDL: 211.84
Total CHOL/HDL Ratio: 6
Triglycerides: 243 mg/dL — ABNORMAL HIGH (ref 0.0–149.0)
VLDL: 48.6 mg/dL — ABNORMAL HIGH (ref 0.0–40.0)

## 2022-08-12 LAB — POCT URINALYSIS DIPSTICK
Bilirubin, UA: NEGATIVE
Blood, UA: NEGATIVE
Glucose, UA: NEGATIVE
Ketones, UA: NEGATIVE
Leukocytes, UA: NEGATIVE
Nitrite, UA: NEGATIVE
Protein, UA: NEGATIVE
Spec Grav, UA: 1.015 (ref 1.010–1.025)
Urobilinogen, UA: 0.2 E.U./dL
pH, UA: 6.5 (ref 5.0–8.0)

## 2022-08-12 LAB — CBC WITH DIFFERENTIAL/PLATELET
Basophils Absolute: 0 10*3/uL (ref 0.0–0.1)
Basophils Relative: 0.7 % (ref 0.0–3.0)
Eosinophils Absolute: 0.1 10*3/uL (ref 0.0–0.7)
Eosinophils Relative: 2.5 % (ref 0.0–5.0)
HCT: 43.5 % (ref 36.0–46.0)
Hemoglobin: 14.5 g/dL (ref 12.0–15.0)
Lymphocytes Relative: 31.3 % (ref 12.0–46.0)
Lymphs Abs: 1.6 10*3/uL (ref 0.7–4.0)
MCHC: 33.4 g/dL (ref 30.0–36.0)
MCV: 88.8 fl (ref 78.0–100.0)
Monocytes Absolute: 0.3 10*3/uL (ref 0.1–1.0)
Monocytes Relative: 6 % (ref 3.0–12.0)
Neutro Abs: 3.1 10*3/uL (ref 1.4–7.7)
Neutrophils Relative %: 59.5 % (ref 43.0–77.0)
Platelets: 271 10*3/uL (ref 150.0–400.0)
RBC: 4.9 Mil/uL (ref 3.87–5.11)
RDW: 13.5 % (ref 11.5–15.5)
WBC: 5.2 10*3/uL (ref 4.0–10.5)

## 2022-08-12 LAB — LDL CHOLESTEROL, DIRECT: Direct LDL: 183 mg/dL

## 2022-08-12 LAB — HEMOGLOBIN A1C: Hgb A1c MFr Bld: 6.8 % — ABNORMAL HIGH (ref 4.6–6.5)

## 2022-08-12 LAB — TSH: TSH: 4.12 u[IU]/mL (ref 0.35–5.50)

## 2022-08-12 MED ORDER — OMEPRAZOLE 40 MG PO CPDR
40.0000 mg | DELAYED_RELEASE_CAPSULE | Freq: Every day | ORAL | 1 refills | Status: DC
  Filled 2022-08-12: qty 90, 90d supply, fill #0
  Filled 2022-11-09: qty 90, 90d supply, fill #1

## 2022-08-12 MED ORDER — IOHEXOL 300 MG/ML  SOLN
100.0000 mL | Freq: Once | INTRAMUSCULAR | Status: AC | PRN
  Administered 2022-08-12: 100 mL via INTRAVENOUS

## 2022-08-12 MED ORDER — TIRZEPATIDE 2.5 MG/0.5ML ~~LOC~~ SOAJ
2.5000 mg | SUBCUTANEOUS | 0 refills | Status: DC
  Filled 2022-08-12: qty 2, 28d supply, fill #0

## 2022-08-12 MED ORDER — LEVOTHYROXINE SODIUM 150 MCG PO TABS
150.0000 ug | ORAL_TABLET | Freq: Every day | ORAL | 3 refills | Status: DC
  Filled 2022-08-12: qty 90, 90d supply, fill #0
  Filled 2022-11-09: qty 90, 90d supply, fill #1
  Filled 2023-02-25: qty 90, 90d supply, fill #2
  Filled 2023-05-26: qty 90, 90d supply, fill #3

## 2022-08-12 MED ORDER — ATORVASTATIN CALCIUM 20 MG PO TABS
20.0000 mg | ORAL_TABLET | Freq: Every day | ORAL | 1 refills | Status: DC
  Filled 2022-08-12: qty 90, 90d supply, fill #0
  Filled 2022-11-09: qty 90, 90d supply, fill #1

## 2022-08-12 MED ORDER — AMOXICILLIN-POT CLAVULANATE 875-125 MG PO TABS
1.0000 | ORAL_TABLET | Freq: Two times a day (BID) | ORAL | 0 refills | Status: DC
  Filled 2022-08-12: qty 20, 10d supply, fill #0

## 2022-08-12 NOTE — Progress Notes (Signed)
Subjective:     Patient ID: Daisy Ramirez, female    DOB: [birth year], 51 y.o.   MRN: [id number]  Chief Complaint  Patient presents with   Follow-up    3 month follow-up for dm Fasting    Abdominal Pain    Stomach  pain started 1 month ago, getting worse past couple of weeks    HPI  DM type 2-not on metformin.  Working on diet.  Walking.  2.  Hashimoto's thyroiditis-on synthroid 137 3.  Migraine-on propranolol ER '120mg'$  not work so off.  Migraines-more weather related.  Has done imitrex-not work.  Nothing since 2020.  Not incapacitating.  4.  PCOS-off all meds.  Seeing Dr. 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   Sinusitis 09/05/2015   Synovitis of right knee 11/11/2017   UTI (lower urinary tract infection)     Past Surgical History:  Procedure Laterality Date   ABDOMINAL HYSTERECTOMY  11/16/2012   Procedure: HYSTERECTOMY ABDOMINAL;  Surgeon: MCyril Mourning MD;  [facility];  Service: Gynecology;  Laterality: N/A;   bilateral knee scope  1997   CHONDROPLASTY Right 11/16/2017   Procedure:  CHONDROPLASTY;  Surgeon: WElsie Saas MD;  [place] [facility]  Service: Orthopedics;  Laterality: Right;   KNEE ARTHROSCOPY WITH LATERAL MENISECTOMY Right 11/16/2017   Procedure: ARTHROSCOPY RIGHT KNEE WITH SYNOVECTOMY, CHONDROPLASTY, PARTIAL LATERAL MENISECTOMY;  Surgeon: WElsie Saas MD;  [place]  Service: Orthopedics;  Laterality: Right;   KNEE SURGERY     x 2   KNEE SURGERY     bilateral   SALPINGOOPHORECTOMY  11/16/2012   Procedure: SALPINGO OOPHORECTOMY;  Surgeon: MCyril Mourning MD;  [facility];  Service: Gynecology;  Laterality: Bilateral;    Outpatient Medications Prior to Visit  Medication Sig Dispense Refill   levothyroxine (SYNTHROID) 137 MCG tablet Take 1 tablet by mouth daily 90 tablet 3   Omega-3 Fatty Acids (FISH OIL PO) Take 1 capsule by mouth daily.     vitamin B-12 (CYANOCOBALAMIN) 500 MCG tablet Take 500 mcg by mouth daily.     VITAMIN D PO Take 1 capsule by mouth daily.     cefdinir (OMNICEF) 300 MG capsule Take 1 capsule (300 mg total) by mouth 2 (two) times daily. 20 capsule 0   hydrOXYzine (ATARAX) 10 MG tablet Take 1 tablet (10 mg total) by mouth at bedtime. 30 tablet 1   metFORMIN (GLUCOPHAGE-XR) 500 MG 24  hr tablet TAKE 1 TABLET BY MOUTH ONCE A DAY 90 tablet 3   spironolactone (ALDACTONE) 100 MG tablet Take 100 mg daily by mouth.     No facility-administered medications prior to visit.    No Known Allergies ROS neg/noncontributory except as noted HPI/below Tired all the time.  Wanting to nap few x/day.  Adderall helped.   Does snore and wakes self up. Husb has had to sleep in other room.  Not fall asleep driving/passenger. Naps at 5pm. Can fall asleep to TV.  Husb leaves TV on all night. Hasn't seen ENT yet-had to go to [place] for step dau pre term birth(baby not survive).  Abd pain dull aches to sharp pains for 1 mo-getting worse.  On LUQ. No change w/food.  Occ diarrhea.  Going 2-3x/day.  No rad. No  f/c. No vomiting.  Feeling more cold.  No dysuria.   When on ozempic, kidneys hurt and urine dark.        Objective:     BP 124/82   Pulse 81   Temp 98.1 F (36.7 C) (Temporal)   Ht '5\' 7"'$  (1.702 m)   Wt 229 lb (103.9 kg)   LMP 10/18/2012   SpO2 98%   BMI 35.87 kg/m  Wt Readings from Last 3 Encounters:  08/12/22 229 lb (103.9 kg)  05/12/22 224 lb 6 oz (101.8 kg)  01/14/22 218 lb 2 oz (98.9 kg)    Physical Exam   Gen: WDWN NAD HEENT: NCAT, conjunctiva not injected, sclera nonicteric NECK:  supple, no thyromegaly, no nodes, no carotid bruits CARDIAC: RRR, S1S2+, no murmur. DP 2+B LUNGS: CTAB. No wheezes ABDOMEN:  BS+, soft, mod tender all over, No HSM, no masses EXT:  no edema MSK: no gross abnormalities.  NEURO: A&O x3.  CN II-XII intact.  PSYCH: normal mood. Good eye contact  Results for orders placed or performed in visit on 08/12/22  POCT urinalysis dipstick  Result Value Ref Range   Color, UA YELLOW    Clarity, UA CLEAR    Glucose, UA Negative Negative   Bilirubin, UA NEGATIVE    Ketones, UA NEGATIVE    Spec Grav, UA 1.015 1.010 - 1.025   Blood, UA NEGATIVE    pH, UA 6.5 5.0 - 8.0   Protein, UA Negative Negative   Urobilinogen, UA 0.2 0.2 or 1.0 E.U./dL   Nitrite, UA NEGATIVE    Leukocytes, UA Negative Negative   Appearance     Odor NONE         Assessment & Plan:   Problem List Items Addressed This Visit       Endocrine   HASHIMOTO'S THYROIDITIS   Relevant Orders   TSH   Type 2 diabetes mellitus without complication, without long-term current use of insulin ([facility]) - Primary   Relevant Orders   Comprehensive metabolic panel   Hemoglobin A1c   Lipid panel     Other   Vitamin B12 deficiency   Other Visit Diagnoses     Generalized abdominal pain       Relevant Orders   Comprehensive metabolic panel   CBC with Differential/Platelet   POCT urinalysis dipstick      Abd pain-acute-check cmp,cbcd,ua.  CT abd-?diverticulitis.  Augmentin 875  bid to hold.   DM type 2-controlled.  Cont TLC  check A1C,lipids, cmp.  SE to ozempic.  Consider mounjaro.  Hashimoto's-chronic.  Controlled on synthroid 137-cont.  Check tsh.  Further plan to be determined.  Abd pain over-rids other.  No orders of the defined types were placed in this encounter.   Wellington Hampshire, MD

## 2022-08-12 NOTE — Patient Instructions (Signed)

## 2022-08-13 ENCOUNTER — Other Ambulatory Visit (HOSPITAL_COMMUNITY): Payer: Self-pay

## 2022-09-10 ENCOUNTER — Other Ambulatory Visit: Payer: Self-pay | Admitting: Family Medicine

## 2022-09-10 ENCOUNTER — Other Ambulatory Visit (HOSPITAL_COMMUNITY): Payer: Self-pay

## 2022-09-10 MED ORDER — TIRZEPATIDE 5 MG/0.5ML ~~LOC~~ SOAJ
5.0000 mg | SUBCUTANEOUS | 0 refills | Status: DC
  Filled 2022-09-10: qty 2, 28d supply, fill #0

## 2022-09-16 ENCOUNTER — Other Ambulatory Visit (HOSPITAL_COMMUNITY): Payer: Self-pay

## 2022-10-12 ENCOUNTER — Other Ambulatory Visit: Payer: Self-pay | Admitting: Family Medicine

## 2022-10-13 ENCOUNTER — Other Ambulatory Visit: Payer: Self-pay | Admitting: Family Medicine

## 2022-10-13 MED ORDER — TIRZEPATIDE 7.5 MG/0.5ML ~~LOC~~ SOAJ
7.5000 mg | SUBCUTANEOUS | 1 refills | Status: DC
  Filled 2022-10-13: qty 2, 28d supply, fill #0

## 2022-10-13 NOTE — Telephone Encounter (Signed)
Left message to return call to office.

## 2022-10-14 ENCOUNTER — Other Ambulatory Visit (HOSPITAL_COMMUNITY): Payer: Self-pay

## 2022-10-23 ENCOUNTER — Other Ambulatory Visit (HOSPITAL_COMMUNITY): Payer: Self-pay

## 2022-11-09 ENCOUNTER — Encounter: Payer: Self-pay | Admitting: Family Medicine

## 2022-11-09 ENCOUNTER — Ambulatory Visit (INDEPENDENT_AMBULATORY_CARE_PROVIDER_SITE_OTHER): Payer: 59 | Admitting: Family Medicine

## 2022-11-09 ENCOUNTER — Other Ambulatory Visit (HOSPITAL_COMMUNITY): Payer: Self-pay

## 2022-11-09 VITALS — BP 116/70 | HR 83 | Temp 98.4°F | Ht 67.0 in | Wt 215.1 lb

## 2022-11-09 DIAGNOSIS — E063 Autoimmune thyroiditis: Secondary | ICD-10-CM | POA: Diagnosis not present

## 2022-11-09 DIAGNOSIS — E782 Mixed hyperlipidemia: Secondary | ICD-10-CM | POA: Diagnosis not present

## 2022-11-09 DIAGNOSIS — E119 Type 2 diabetes mellitus without complications: Secondary | ICD-10-CM

## 2022-11-09 DIAGNOSIS — Z23 Encounter for immunization: Secondary | ICD-10-CM

## 2022-11-09 DIAGNOSIS — Z1211 Encounter for screening for malignant neoplasm of colon: Secondary | ICD-10-CM | POA: Diagnosis not present

## 2022-11-09 LAB — LIPID PANEL
Cholesterol: 127 mg/dL (ref 0–200)
HDL: 36.7 mg/dL — ABNORMAL LOW (ref >39.00)
LDL Cholesterol: 61 mg/dL (ref 0–99)
NonHDL: 90.63
Total CHOL/HDL Ratio: 3
Triglycerides: 148 mg/dL (ref 0.0–149.0)
VLDL: 29.6 mg/dL (ref 0.0–40.0)

## 2022-11-09 LAB — COMPREHENSIVE METABOLIC PANEL
ALT: 54 U/L — ABNORMAL HIGH (ref 0–35)
AST: 28 U/L (ref 0–37)
Albumin: 4.5 g/dL (ref 3.5–5.2)
Alkaline Phosphatase: 103 U/L (ref 39–117)
BUN: 8 mg/dL (ref 6–23)
CO2: 26 mEq/L (ref 19–32)
Calcium: 9.4 mg/dL (ref 8.4–10.5)
Chloride: 108 mEq/L (ref 96–112)
Creatinine, Ser: 0.84 mg/dL (ref 0.40–1.20)
GFR: 80.64 mL/min (ref >60.00)
Glucose, Bld: 86 mg/dL (ref 70–99)
Potassium: 4.6 mEq/L (ref 3.5–5.1)
Sodium: 142 mEq/L (ref 135–145)
Total Bilirubin: 0.5 mg/dL (ref 0.2–1.2)
Total Protein: 6.6 g/dL (ref 6.0–8.3)

## 2022-11-09 LAB — TSH: TSH: 0.34 u[IU]/mL — ABNORMAL LOW (ref 0.35–5.50)

## 2022-11-09 MED ORDER — TIRZEPATIDE 10 MG/0.5ML ~~LOC~~ SOAJ
10.0000 mg | SUBCUTANEOUS | 3 refills | Status: DC
  Filled 2022-11-09: qty 2, 28d supply, fill #0
  Filled 2022-12-07: qty 2, 28d supply, fill #1
  Filled 2023-01-04: qty 2, 28d supply, fill #2
  Filled 2023-02-01: qty 2, 28d supply, fill #3
  Filled 2023-02-25: qty 2, 28d supply, fill #4

## 2022-11-09 NOTE — Progress Notes (Signed)
Subjective:     Patient ID: Daisy Ramirez, female    DOB: July 24, 1971, 51 y.o.   MRN: [id number]  Chief Complaint  Patient presents with   Follow-up    3 month follow-up on weight and dm Fasting    HPI  HLD-on atorvastatin '20mg'$  DM type 2-working on diet/exercise.  On Mounjaro-has lost 14# in 3 months! 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   Sinusitis 09/05/2015   Synovitis of right knee 11/11/2017   UTI (lower urinary tract infection)     Past Surgical History:  Procedure Laterality Date   ABDOMINAL HYSTERECTOMY  11/16/2012   Procedure: HYSTERECTOMY ABDOMINAL;  Surgeon: MCyril Mourning MD;  [facility] [facility];  Service: Gynecology;  Laterality: N/A;   bilateral knee scope  1997   CHONDROPLASTY Right 11/16/2017   Procedure: CHONDROPLASTY;  Surgeon: WElsie Saas MD;  [place]  Service: Orthopedics;  Laterality: Right;   KNEE ARTHROSCOPY WITH LATERAL MENISECTOMY  Right 11/16/2017   Procedure: ARTHROSCOPY RIGHT KNEE WITH SYNOVECTOMY, CHONDROPLASTY, PARTIAL LATERAL MENISECTOMY;  Surgeon: WElsie Saas MD;  [place]  Service: Orthopedics;  Laterality: Right;   KNEE SURGERY     x 2   KNEE SURGERY     bilateral   SALPINGOOPHORECTOMY  11/16/2012   Procedure: SALPINGO OOPHORECTOMY;  Surgeon: MCyril Mourning MD;  [place] [facility];  Service: Gynecology;  Laterality: Bilateral;    Outpatient Medications Prior to Visit  Medication Sig Dispense Refill   amoxicillin-clavulanate (AUGMENTIN) 875-125 MG tablet Take 1 tablet by mouth 2 (two) times daily. 20 tablet 0   atorvastatin (LIPITOR) 20 MG tablet Take 1 tablet (20 mg total) by mouth daily. 90 tablet 1   levothyroxine (SYNTHROID) 150 MCG tablet Take 1 tablet (150 mcg total) by mouth daily before breakfast. 90 tablet 3   Omega-3 Fatty Acids (FISH OIL PO) Take 1 capsule by mouth daily.     omeprazole (PRILOSEC) 40 MG capsule Take 1 capsule (40 mg total) by mouth daily. 90 capsule 1   tirzepatide (MOUNJARO) 7.5 MG/0.5ML Pen Inject 7.5 mg into the skin once a week. 6 mL 1   vitamin B-12 (CYANOCOBALAMIN) 500 MCG tablet Take 500 mcg by mouth daily.     VITAMIN D PO Take 1 capsule by mouth daily.     No facility-administered medications prior to visit.  No Known Allergies ROS neg/noncontributory except as noted HPI/below      Objective:     BP 116/70   Pulse 83   Temp 98.4 F (36.9 C) (Temporal)   Ht '5\' 7"'$  (1.702 m)   Wt 215 lb 2 oz (97.6 kg)   LMP 10/18/2012   SpO2 99%   BMI 33.69 kg/m  Wt Readings from Last 3 Encounters:  11/09/22 215 lb 2 oz (97.6 kg)  08/12/22 229 lb (103.9 kg)  05/12/22 224 lb 6 oz (101.8 kg)    Physical Exam   Gen: WDWN NAD HEENT: NCAT, conjunctiva not injected, sclera nonicteric NECK:  supple, no thyromegaly, no nodes, no carotid bruits CARDIAC: RRR, S1S2+, no murmur. DP 2+B LUNGS: CTAB. No wheezes ABDOMEN:  BS+, soft, NTND, No  HSM, no masses EXT:  no edema MSK: no gross abnormalities.  NEURO: A&O x3.  CN II-XII intact.  PSYCH: normal mood. Good eye contact  Diabetic Foot Exam - Simple   Simple Foot Form Diabetic Foot exam was performed with the following findings: Yes 11/09/2022  8:17 AM  Visual Inspection No deformities, no ulcerations, no other skin breakdown bilaterally: Yes Sensation Testing Intact to touch and monofilament testing bilaterally: Yes Pulse Check Posterior Tibialis and Dorsalis pulse intact bilaterally: Yes Comments         Assessment & Plan:   Problem List Items Addressed This Visit       Endocrine   HASHIMOTO'S THYROIDITIS   Type 2 diabetes mellitus without complication, without long-term current use of insulin ([place]) - Primary     Other   Mixed hyperlipidemia   Mixed HLD-chronic.  Continue on atorvastatin '20mg'$ .  Check cmp and lipids DM type 2-chronic.  Losing wt on Mounjaro-will increase to '10mg'$  weekly.  Foot exam done.  Get ophth sch Hashimoto's thryoiditis-chronic.  Increased synthroid to 150 last labs.  Check tsh.  May need to adjust dose Screen colon-no fh ca.  Pt agreeable to cologard.     Send copy mamm F/u 3 mo  No orders of the defined types were placed in this encounter.   Wellington Hampshire, MD

## 2022-11-09 NOTE — Patient Instructions (Signed)
It was very nice to see you today!  Great job!!!  Increase Mounjaro to '10mg'$  weekly   PLEASE NOTE:  If you had any lab tests please let us know if you have not heard back within a few days. You may see your results on MyChart before we have a chance to review them but we will give you a call once they are reviewed by [place]. If we ordered any referrals today, please let us know if you have not heard from their office within the next week.   Please try these tips to maintain a healthy lifestyle:  Eat most of your calories during the day when you are active. Eliminate processed foods including packaged sweets (pies, cakes, cookies), reduce intake of potatoes, white bread, white pasta, and white rice. Look for whole grain options, oat flour or almond flour.  Each meal should contain half fruits/vegetables, one quarter protein, and one quarter carbs (no bigger than a computer mouse).  Cut down on sweet beverages. This includes juice, soda, and sweet tea. Also watch fruit intake, though this is a healthier sweet option, it still contains natural sugar! Limit to 3 servings daily.  Drink at least 1 glass of water with each meal and aim for at least 8 glasses per day  Exercise at least 150 minutes every week.

## 2022-11-09 NOTE — Progress Notes (Signed)
Continue atorvastatin-chole great!.  Thyroid over corrected now so one day/wk, skip the dose

## 2022-11-10 ENCOUNTER — Other Ambulatory Visit (HOSPITAL_COMMUNITY): Payer: Self-pay

## 2022-11-12 ENCOUNTER — Other Ambulatory Visit (HOSPITAL_COMMUNITY): Payer: Self-pay

## 2022-12-07 ENCOUNTER — Other Ambulatory Visit (HOSPITAL_COMMUNITY): Payer: Self-pay

## 2022-12-09 DIAGNOSIS — Z1211 Encounter for screening for malignant neoplasm of colon: Secondary | ICD-10-CM | POA: Diagnosis not present

## 2022-12-09 LAB — COLOGUARD: Cologuard: NEGATIVE

## 2022-12-16 LAB — COLOGUARD: COLOGUARD: NEGATIVE

## 2022-12-17 ENCOUNTER — Encounter: Payer: Self-pay | Admitting: Family Medicine

## 2023-01-04 ENCOUNTER — Other Ambulatory Visit (HOSPITAL_COMMUNITY): Payer: Self-pay

## 2023-02-02 ENCOUNTER — Other Ambulatory Visit: Payer: Self-pay

## 2023-02-09 ENCOUNTER — Ambulatory Visit: Payer: Self-pay | Admitting: Family Medicine

## 2023-02-25 ENCOUNTER — Other Ambulatory Visit (HOSPITAL_COMMUNITY): Payer: Self-pay

## 2023-02-25 ENCOUNTER — Other Ambulatory Visit: Payer: Self-pay | Admitting: Family Medicine

## 2023-02-25 ENCOUNTER — Other Ambulatory Visit: Payer: Self-pay

## 2023-02-25 MED ORDER — ATORVASTATIN CALCIUM 20 MG PO TABS
20.0000 mg | ORAL_TABLET | Freq: Every day | ORAL | 1 refills | Status: DC
  Filled 2023-02-25: qty 90, 90d supply, fill #0
  Filled 2023-05-26: qty 90, 90d supply, fill #1

## 2023-02-25 MED ORDER — OMEPRAZOLE 40 MG PO CPDR
40.0000 mg | DELAYED_RELEASE_CAPSULE | Freq: Every day | ORAL | 1 refills | Status: DC
  Filled 2023-02-25: qty 90, 90d supply, fill #0
  Filled 2023-05-26: qty 90, 90d supply, fill #1

## 2023-02-26 ENCOUNTER — Other Ambulatory Visit: Payer: Self-pay

## 2023-03-23 ENCOUNTER — Other Ambulatory Visit: Payer: Self-pay

## 2023-03-23 ENCOUNTER — Encounter: Payer: Self-pay | Admitting: Family Medicine

## 2023-03-23 ENCOUNTER — Ambulatory Visit (INDEPENDENT_AMBULATORY_CARE_PROVIDER_SITE_OTHER): Payer: Commercial Managed Care - PPO | Admitting: Family Medicine

## 2023-03-23 VITALS — BP 120/84 | HR 77 | Temp 98.1°F | Ht 67.0 in | Wt 191.2 lb

## 2023-03-23 DIAGNOSIS — E063 Autoimmune thyroiditis: Secondary | ICD-10-CM

## 2023-03-23 DIAGNOSIS — E782 Mixed hyperlipidemia: Secondary | ICD-10-CM | POA: Diagnosis not present

## 2023-03-23 DIAGNOSIS — E119 Type 2 diabetes mellitus without complications: Secondary | ICD-10-CM

## 2023-03-23 DIAGNOSIS — Z23 Encounter for immunization: Secondary | ICD-10-CM | POA: Diagnosis not present

## 2023-03-23 MED ORDER — TIRZEPATIDE 12.5 MG/0.5ML ~~LOC~~ SOAJ
12.5000 mg | SUBCUTANEOUS | 1 refills | Status: DC
  Filled 2023-03-23: qty 2, 28d supply, fill #0

## 2023-03-23 MED ORDER — TIRZEPATIDE 7.5 MG/0.5ML ~~LOC~~ SOAJ
7.5000 mg | SUBCUTANEOUS | 1 refills | Status: DC
  Filled 2023-03-23: qty 2, 28d supply, fill #0

## 2023-03-23 NOTE — Progress Notes (Signed)
Subjective:     Patient ID: Daisy Ramirez, female    DOB: 06/27/1971, 52 y.o.   MRN: [id number]  Chief Complaint  Patient presents with   Follow-up    3 month follow-up for dm Not fasting     HPI DM-doing well on mounjaro 10mg .  - has lost 28# since August.  No SE but now Plateaued. Walking daily.  No oph yet 2.  HLD-on atorvastatin 20mg .  Doing well.  Ate 2.5 hrs ago. 3.  Hypothyroidism-on 150.  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 [facility])    childhood after 2 concussions   Sinusitis 09/05/2015   Synovitis of right knee 11/11/2017   UTI (lower urinary tract infection)     Past Surgical History:  Procedure Laterality Date   ABDOMINAL HYSTERECTOMY  11/16/2012   Procedure: HYSTERECTOMY ABDOMINAL;  Surgeon: Cyril Mourning, MD;  [facility] [place] [facility];  Service: Gynecology;  Laterality: N/A;   bilateral knee scope  1997   CHONDROPLASTY Right 11/16/2017   Procedure: CHONDROPLASTY;  Surgeon: Elsie Saas, MD;  Location: [place];  Service: Orthopedics;  Laterality: Right;   KNEE ARTHROSCOPY WITH LATERAL MENISECTOMY Right 11/16/2017   Procedure: ARTHROSCOPY RIGHT KNEE WITH SYNOVECTOMY, CHONDROPLASTY, PARTIAL LATERAL MENISECTOMY;  Surgeon:  Elsie Saas, MD;  Location: [place];  Service: Orthopedics;  Laterality: Right;   KNEE SURGERY     x 2   KNEE SURGERY     bilateral   SALPINGOOPHORECTOMY  11/16/2012   Procedure: SALPINGO OOPHORECTOMY;  Surgeon: Cyril Mourning, MD;  Location: [place] [facility];  Service: Gynecology;  Laterality: Bilateral;    Outpatient Medications Prior to Visit  Medication Sig Dispense Refill   atorvastatin (LIPITOR) 20 MG tablet Take 1 tablet (20 mg total) by mouth daily. 90 tablet 1   levothyroxine (SYNTHROID) 150 MCG tablet Take 1 tablet (150 mcg total) by mouth daily before breakfast. 90 tablet 3   Omega-3 Fatty Acids (FISH OIL PO) Take 1 capsule by mouth daily.     omeprazole (PRILOSEC) 40 MG capsule Take 1 capsule (40 mg total) by mouth daily. 90 capsule 1   vitamin B-12 (CYANOCOBALAMIN) 500 MCG tablet Take 500 mcg by mouth daily.     VITAMIN D PO Take 1 capsule by mouth daily.     tirzepatide (MOUNJARO) 10 MG/0.5ML Pen Inject 10 mg into the skin once a week. 6 mL 3   No facility-administered medications prior to visit.    No Known Allergies ROS neg/noncontributory except as noted HPI/below Chronic joint pains-knee surgeries in past.      Objective:     BP 120/84  Pulse 77   Temp 98.1 F (36.7 C) (Temporal)   Ht 5\' 7"  (1.702 m)   Wt 191 lb 4 oz (86.8 kg)   LMP 10/18/2012   SpO2 99%   BMI 29.95 kg/m  Wt Readings from Last 3 Encounters:  03/23/23 191 lb 4 oz (86.8 kg)  11/09/22 215 lb 2 oz (97.6 kg)  08/12/22 229 lb (103.9 kg)    Physical Exam   Gen: WDWN NAD HEENT: NCAT, conjunctiva not injected, sclera nonicteric NECK:  supple, no thyromegaly, no nodes, no carotid bruits CARDIAC: RRR, S1S2+, no murmur. DP 2+B LUNGS: CTAB. No wheezes ABDOMEN:  BS+, soft, NTND, No HSM, no masses EXT:  no edema MSK: no gross abnormalities.  NEURO: A&O x3.  CN II-XII intact.  PSYCH: normal mood. Good eye contact     Assessment & Plan:   Problem List Items Addressed This  Visit       Endocrine   HASHIMOTO'S THYROIDITIS   Type 2 diabetes mellitus without complication, without long-term current use of insulin (H[facility])   Relevant Medications   tirzepatide (MOUNJARO) 7.5 MG/0.5ML Pen     Other   Mixed hyperlipidemia   Other Visit Diagnoses     Need for shingles vaccine    -  Primary   Relevant Orders   Zoster Recombinant (Shingrix )      Type 2 dm-chronic.  Controlled.  Doing well on Mounjaro but wt plateau-will increase mounjaro to 12.5mg .  let me know in 1 mo if same or increase to 15.  F/u 3 mo annual and will do labs Hashimoto's thyroiditis-chronic.  On synthroid-f/u 3 mo HLD-chronic.  Controlled.  Cont lipitor 20mg . Check labs 3 mo  Meds ordered this encounter  Medications   tirzepatide (MOUNJARO) 7.5 MG/0.5ML Pen    Sig: Inject 7.5 mg into the skin once a week.    Dispense:  2 mL    Refill:  1    Wellington Hampshire, MD

## 2023-03-23 NOTE — Patient Instructions (Addendum)
It was very nice to see you today!  Take tumeric daily Get in to see Derm See opt   PLEASE NOTE:  If you had any lab tests please let us know if you have not heard back within a few days. You may see your results on MyChart before we have a chance to review them but we will give you a call once they are reviewed by [place]. If we ordered any referrals today, please let us know if you have not heard from their office within the next week.   Please try these tips to maintain a healthy lifestyle:  Eat most of your calories during the day when you are active. Eliminate processed foods including packaged sweets (pies, cakes, cookies), reduce intake of potatoes, white bread, white pasta, and white rice. Look for whole grain options, oat flour or almond flour.  Each meal should contain half fruits/vegetables, one quarter protein, and one quarter carbs (no bigger than a computer mouse).  Cut down on sweet beverages. This includes juice, soda, and sweet tea. Also watch fruit intake, though this is a healthier sweet option, it still contains natural sugar! Limit to 3 servings daily.  Drink at least 1 glass of water with each meal and aim for at least 8 glasses per day  Exercise at least 150 minutes every week.

## 2023-03-30 ENCOUNTER — Other Ambulatory Visit: Payer: Self-pay | Admitting: Family Medicine

## 2023-03-30 ENCOUNTER — Other Ambulatory Visit (HOSPITAL_COMMUNITY): Payer: Self-pay

## 2023-03-30 MED ORDER — OZEMPIC (2 MG/DOSE) 8 MG/3ML ~~LOC~~ SOPN
2.0000 mg | PEN_INJECTOR | SUBCUTANEOUS | 3 refills | Status: DC
  Filled 2023-03-30: qty 3, 28d supply, fill #0
  Filled 2023-04-27: qty 3, 28d supply, fill #1
  Filled 2023-05-26: qty 3, 28d supply, fill #2

## 2023-03-30 NOTE — Progress Notes (Signed)
Shortage on Gary so changed to [employer]

## 2023-03-30 NOTE — Telephone Encounter (Signed)
Per pt's pharmacy this medication is on back order . Pt requesting ozempic to be sent in instead .

## 2023-04-01 ENCOUNTER — Other Ambulatory Visit: Payer: Self-pay

## 2023-04-19 ENCOUNTER — Other Ambulatory Visit (HOSPITAL_COMMUNITY): Payer: Self-pay

## 2023-04-27 ENCOUNTER — Other Ambulatory Visit (HOSPITAL_COMMUNITY): Payer: Self-pay

## 2023-05-26 ENCOUNTER — Other Ambulatory Visit: Payer: Self-pay

## 2023-05-26 ENCOUNTER — Other Ambulatory Visit (HOSPITAL_COMMUNITY): Payer: Self-pay

## 2023-06-23 ENCOUNTER — Other Ambulatory Visit (HOSPITAL_COMMUNITY): Payer: Self-pay

## 2023-06-23 ENCOUNTER — Ambulatory Visit (INDEPENDENT_AMBULATORY_CARE_PROVIDER_SITE_OTHER): Payer: Commercial Managed Care - PPO | Admitting: Family Medicine

## 2023-06-23 ENCOUNTER — Encounter: Payer: Self-pay | Admitting: Family Medicine

## 2023-06-23 ENCOUNTER — Other Ambulatory Visit: Payer: Self-pay

## 2023-06-23 VITALS — BP 120/82 | HR 61 | Temp 98.2°F | Resp 16 | Ht 67.0 in | Wt 188.2 lb

## 2023-06-23 DIAGNOSIS — E538 Deficiency of other specified B group vitamins: Secondary | ICD-10-CM

## 2023-06-23 DIAGNOSIS — E063 Autoimmune thyroiditis: Secondary | ICD-10-CM | POA: Diagnosis not present

## 2023-06-23 DIAGNOSIS — Z1159 Encounter for screening for other viral diseases: Secondary | ICD-10-CM | POA: Diagnosis not present

## 2023-06-23 DIAGNOSIS — E782 Mixed hyperlipidemia: Secondary | ICD-10-CM | POA: Diagnosis not present

## 2023-06-23 DIAGNOSIS — E119 Type 2 diabetes mellitus without complications: Secondary | ICD-10-CM | POA: Diagnosis not present

## 2023-06-23 DIAGNOSIS — Z0000 Encounter for general adult medical examination without abnormal findings: Secondary | ICD-10-CM

## 2023-06-23 DIAGNOSIS — Z7985 Long-term (current) use of injectable non-insulin antidiabetic drugs: Secondary | ICD-10-CM | POA: Diagnosis not present

## 2023-06-23 LAB — COMPREHENSIVE METABOLIC PANEL
ALT: 33 U/L (ref 0–35)
AST: 19 U/L (ref 0–37)
Albumin: 4.8 g/dL (ref 3.5–5.2)
Alkaline Phosphatase: 94 U/L (ref 39–117)
BUN: 10 mg/dL (ref 6–23)
CO2: 23 mEq/L (ref 19–32)
Calcium: 10.2 mg/dL (ref 8.4–10.5)
Chloride: 104 mEq/L (ref 96–112)
Creatinine, Ser: 0.78 mg/dL (ref 0.40–1.20)
GFR: 87.76 mL/min (ref >60.00)
Glucose, Bld: 86 mg/dL (ref 70–99)
Potassium: 3.9 mEq/L (ref 3.5–5.1)
Sodium: 139 mEq/L (ref 135–145)
Total Bilirubin: 0.6 mg/dL (ref 0.2–1.2)
Total Protein: 7.4 g/dL (ref 6.0–8.3)

## 2023-06-23 LAB — CBC WITH DIFFERENTIAL/PLATELET
Basophils Absolute: 0 10*3/uL (ref 0.0–0.1)
Basophils Relative: 0.5 % (ref 0.0–3.0)
Eosinophils Absolute: 0.1 10*3/uL (ref 0.0–0.7)
Eosinophils Relative: 2.3 % (ref 0.0–5.0)
HCT: 45.5 % (ref 36.0–46.0)
Hemoglobin: 15.2 g/dL — ABNORMAL HIGH (ref 12.0–15.0)
Lymphocytes Relative: 32.1 % (ref 12.0–46.0)
Lymphs Abs: 1.7 10*3/uL (ref 0.7–4.0)
MCHC: 33.5 g/dL (ref 30.0–36.0)
MCV: 88.2 fl (ref 78.0–100.0)
Monocytes Absolute: 0.3 10*3/uL (ref 0.1–1.0)
Monocytes Relative: 5.9 % (ref 3.0–12.0)
Neutro Abs: 3.1 10*3/uL (ref 1.4–7.7)
Neutrophils Relative %: 59.2 % (ref 43.0–77.0)
Platelets: 301 10*3/uL (ref 150.0–400.0)
RBC: 5.16 Mil/uL — ABNORMAL HIGH (ref 3.87–5.11)
RDW: 13.2 % (ref 11.5–15.5)
WBC: 5.3 10*3/uL (ref 4.0–10.5)

## 2023-06-23 LAB — TESTOSTERONE: Testosterone: 26.08 ng/dL (ref 15.00–40.00)

## 2023-06-23 LAB — T3, FREE: T3, Free: 3.2 pg/mL (ref 2.3–4.2)

## 2023-06-23 LAB — MICROALBUMIN / CREATININE URINE RATIO
Creatinine,U: 77.2 mg/dL
Microalb Creat Ratio: 0.9 mg/g (ref 0.0–30.0)
Microalb, Ur: <0.7 mg/dL (ref 0.0–1.9)

## 2023-06-23 LAB — LIPID PANEL
Cholesterol: 147 mg/dL (ref 0–200)
HDL: 45.2 mg/dL (ref >39.00)
LDL Cholesterol: 70 mg/dL (ref 0–99)
NonHDL: 101.92
Total CHOL/HDL Ratio: 3
Triglycerides: 159 mg/dL — ABNORMAL HIGH (ref 0.0–149.0)
VLDL: 31.8 mg/dL (ref 0.0–40.0)

## 2023-06-23 LAB — VITAMIN D 25 HYDROXY (VIT D DEFICIENCY, FRACTURES): VITD: 45.22 ng/mL (ref 30.00–100.00)

## 2023-06-23 LAB — T4, FREE: Free T4: 1.09 ng/dL (ref 0.60–1.60)

## 2023-06-23 LAB — TSH: TSH: 0.27 u[IU]/mL — ABNORMAL LOW (ref 0.35–5.50)

## 2023-06-23 LAB — HEMOGLOBIN A1C: Hgb A1c MFr Bld: 5.3 % (ref 4.6–6.5)

## 2023-06-23 LAB — VITAMIN B12: Vitamin B-12: 930 pg/mL — ABNORMAL HIGH (ref 211–911)

## 2023-06-23 MED ORDER — TIRZEPATIDE 10 MG/0.5ML ~~LOC~~ SOAJ
10.0000 mg | SUBCUTANEOUS | 1 refills | Status: DC
  Filled 2023-06-23: qty 6, 84d supply, fill #0
  Filled 2023-09-10: qty 6, 84d supply, fill #1

## 2023-06-23 MED ORDER — ATORVASTATIN CALCIUM 20 MG PO TABS
20.0000 mg | ORAL_TABLET | Freq: Every day | ORAL | 1 refills | Status: DC
  Filled 2023-06-23 – 2023-08-31 (×2): qty 90, 90d supply, fill #0

## 2023-06-23 NOTE — Assessment & Plan Note (Signed)
Chronic.  Having symptoms since changed to ozempic.  Check full thyroid panel and adjust dose if needed.  Also check hormonal levels

## 2023-06-23 NOTE — Assessment & Plan Note (Signed)
Chronic.  Controlled on GLP-1 but side effects to ozempic-did better on moiunjaro but shortage.  Change back to 10mg  mounjaro.

## 2023-06-23 NOTE — Assessment & Plan Note (Signed)
Chronic.  Controlled.  Continue lipitor 20mg 

## 2023-06-23 NOTE — Patient Instructions (Addendum)
It was very nice to see you today!  Please send copy eye exam and mammogram   PLEASE NOTE:  If you had any lab tests please let us know if you have not heard back within a few days. You may see your results on MyChart before we have a chance to review them but we will give you a call once they are reviewed by [place]. If we ordered any referrals today, please let us know if you have not heard from their office within the next week.   Please try these tips to maintain a healthy lifestyle:  Eat most of your calories during the day when you are active. Eliminate processed foods including packaged sweets (pies, cakes, cookies), reduce intake of potatoes, white bread, white pasta, and white rice. Look for whole grain options, oat flour or almond flour.  Each meal should contain half fruits/vegetables, one quarter protein, and one quarter carbs (no bigger than a computer mouse).  Cut down on sweet beverages. This includes juice, soda, and sweet tea. Also watch fruit intake, though this is a healthier sweet option, it still contains natural sugar! Limit to 3 servings daily.  Drink at least 1 glass of water with each meal and aim for at least 8 glasses per day  Exercise at least 150 minutes every week.

## 2023-06-23 NOTE — Progress Notes (Signed)
Phone [phone]   Subjective:   Patient is a 52 y.o. female presenting for annual physical.    Chief Complaint  Patient presents with   Annual Exam    CPE fasting Ozempic causing extreme fatigue, hair loss, concentration issues and possible thyroid issues    Annual-August for mamm.  Eye exam overdue.  Occ exercise-no energy DM type 2-ozempic causing issues-fatigue, hair loss.  Was on Mounjaro but hard to get so changed to ozempic 3 mo ago. Hard to concentrate, brain fog, cold all the time. Skin dry. Hashimoto's-on synthroid 150. Skips one day/wk.  Goiter in past and thinks ozempic making it come back.  HLD-doing well on lipitor 20mg .  See problem oriented charting- ROS- ROS: Gen: no fever, chills  Skin: no rash, itching ENT: no ear pain, ear drainage, nasal congestion, rhinorrhea, sinus pressure, sore throat Eyes: no blurry vision, double vision Resp: no cough, wheeze,SOB CV: no CP, palpitations, LE edema,  GI: no heartburn, n/v/d/ abd pain.  Occ constipation/diarrhea/nausea GU: no dysuria, urgency, frequency, hematuria MSK: everything aches Neuro: no, headache, weakness, vertigo.  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   Sinusitis 09/05/2015   Synovitis of right knee 11/11/2017    UTI (lower urinary tract infection)    Patient Active Problem List   Diagnosis Date Noted   Mixed hyperlipidemia 11/09/2022   Primary insomnia 02/12/2022   Type 2 diabetes mellitus without complication, without long-term current use of insulin (HCC) 02/12/2022   Vitamin B12 deficiency 02/12/2022   PCOS (polycystic ovarian syndrome) 02/12/2022   Synovitis of right knee 11/11/2017   Chronic patellofemoral pain of right knee 11/11/2017   Pain of fifth toe 02/14/2016   Sinusitis 09/05/2015   S/P total hysterectomy 09/05/2015   Obesity 09/05/2015   Arthritis    Hypothyroidism 07/18/2014   HASHIMOTO'S THYROIDITIS 04/17/2007   Allergic rhinitis 04/13/2007   ASTHMA 04/13/2007   Migraine 04/13/2007   Past Surgical History:  Procedure Laterality Date   ABDOMINAL HYSTERECTOMY  11/16/2012   Procedure: HYSTERECTOMY ABDOMINAL;  Surgeon: Jeani Hawking, MD;  [facility];  Service: Gynecology;  Laterality: N/A;   bilateral knee scope  1997   CHONDROPLASTY Right 11/16/2017   Procedure: CHONDROPLASTY;  Surgeon: Salvatore Marvel, MD;  Location: [facility];  Service: Orthopedics;  Laterality: Right;   KNEE ARTHROSCOPY WITH LATERAL MENISECTOMY Right 11/16/2017   Procedure: ARTHROSCOPY RIGHT KNEE WITH SYNOVECTOMY, CHONDROPLASTY, PARTIAL LATERAL MENISECTOMY;  Surgeon: Salvatore Marvel, MD;  Location: [facility];  Service: Orthopedics;  Laterality: Right;   KNEE SURGERY     x 2   KNEE SURGERY     bilateral   SALPINGOOPHORECTOMY  11/16/2012   Procedure: SALPINGO OOPHORECTOMY;  Surgeon: Jeani Hawking,  MD;  Location: [facility];  Service: Gynecology;  Laterality: Bilateral;    Family History  Problem Relation Age of Onset   Arthritis Mother    Hypertension Mother    Arthritis Father    Hyperlipidemia Father    Alcohol abuse Paternal Aunt    Alcohol abuse Maternal Grandmother        liver failure   Alcohol abuse Paternal Grandmother        liver failures    Heart disease Maternal Grandfather    Heart disease Paternal Grandfather    Cancer Cousin        Breast Cancer   Breast cancer Cousin    Thyroid disease Neg Hx     Medications- reviewed and updated Current Outpatient Medications  Medication Sig Dispense Refill   levothyroxine (SYNTHROID) 150 MCG tablet Take 1 tablet (150 mcg total) by mouth daily before breakfast. 90 tablet 3   Omega-3 Fatty Acids (FISH OIL PO) Take 1 capsule by mouth daily.     omeprazole (PRILOSEC) 40 MG capsule Take 1 capsule (40 mg total) by mouth daily. 90 capsule 1   tirzepatide (MOUNJARO) 10 MG/0.5ML Pen Inject 10 mg into the skin once a week. 6 mL 1   vitamin B-12 (CYANOCOBALAMIN) 500 MCG tablet Take 500 mcg by mouth daily.     VITAMIN D PO Take 1 capsule by mouth daily.     atorvastatin (LIPITOR) 20 MG tablet Take 1 tablet (20 mg total) by mouth daily. 90 tablet 1   No current facility-administered medications for this visit.    Allergies-reviewed and updated No Known Allergies  Social History   Social History Narrative   Patient lives at home w/ husbBrian. Patient have no children. Patient has a Chiropodist. Patient is left handed.    Objective  Objective:  BP 120/82   Pulse 61   Temp 98.2 F (36.8 C) (Temporal)   Resp 16   Ht 5\' 7"  (1.702 m)   Wt 188 lb 4 oz (85.4 kg)   LMP 10/18/2012   SpO2 99%   BMI 29.48 kg/m  Physical Exam  Gen: WDWN NAD HEENT: NCAT, conjunctiva not injected, sclera nonicteric TM WNL B, OP moist, no exudates  NECK:  supple, no thyromegaly, no nodes, no carotid bruits CARDIAC: RRR, S1S2+, no murmur. DP 2+B LUNGS: CTAB. No wheezes ABDOMEN:  BS+, soft, NTND, No HSM, no masses EXT:  no edema MSK: no gross abnormalities. MS 5/5 all 4 NEURO: A&O x3.  CN II-XII intact.  PSYCH: normal mood. Good eye contact  Increased facial hair    Assessment and Plan   Health Maintenance counseling: 1. Anticipatory guidance: Patient counseled regarding regular dental exams q6  months, eye exams,  avoiding smoking and second hand smoke, limiting alcohol to 1 beverage per day, no illicit drugs.   2. Risk factor reduction:  Advised patient of need for regular exercise and diet rich and fruits and vegetables to reduce risk of heart attack and stroke. Exercise- +.  Wt Readings from Last 3 Encounters:  06/23/23 188 lb 4 oz (85.4 kg)  03/23/23 191 lb 4 oz (86.8 kg)  11/09/22 215 lb 2 oz (97.6 kg)   3. Immunizations/screenings/ancillary studies Immunization History  Administered Date(s) Administered   Influenza,inj,Quad PF,6+ Mos 11/14/2021   Influenza,inj,Quad PF,6-35 Mos 10/11/2022   Influenza-Unspecified 09/27/2013, 08/29/2015, 10/25/2016   PFIZER(Purple Top)SARS-COV-2 Vaccination 12/04/2019, 01/08/2020, 02/14/2020, 03/08/2020   Pneumococcal Polysaccharide-23 11/17/2012   Pneumococcal-Unspecified 10/28/2012   Td 01/07/1999   Tdap 11/09/2022  Zoster Recombinat (Shingrix) 11/09/2022, 03/23/2023   Health Maintenance Due  Topic Date Due   OPHTHALMOLOGY EXAM  Never done   HIV Screening  Never done   Hepatitis C Screening  Never done   MAMMOGRAM  Never done   HEMOGLOBIN A1C  02/12/2023   Diabetic kidney evaluation - Urine ACR  05/13/2023    4. Cervical cancer screening- hyst 5. Breast cancer screening-  mammogram Aug 6. Colon cancer screening - utd 7. Skin cancer screening- advised regular sunscreen use. Denies worrisome, changing, or new skin lesions.  8. Birth control/STD check- n/a 9. Osteoporosis screening- n/a 10. Smoking associated screening - non smoker  Wellness examination -     Vitamin B12 -     T3, free -     T4, free -     Lipid panel -     Comprehensive metabolic panel -     CBC with Differential/Platelet -     Hemoglobin A1c -     TSH -     Microalbumin / creatinine urine ratio -     Hepatitis C antibody -     HIV Antibody (routine testing w rflx) -     Estradiol -     Testosterone -     VITAMIN D 25 Hydroxy (Vit-D Deficiency,  Fractures)  HASHIMOTO'S THYROIDITIS Assessment & Plan: Chronic.  Having symptoms since changed to ozempic.  Check full thyroid panel and adjust dose if needed.  Also check hormonal levels  Orders: -     T3, free -     T4, free -     TSH -     Estradiol -     Testosterone  Type 2 diabetes mellitus without complication, without long-term current use of insulin (HCC) Assessment & Plan: Chronic.  Controlled on GLP-1 but side effects to ozempic-did better on moiunjaro but shortage.  Change back to 10mg  mounjaro.    Orders: -     Hemoglobin A1c -     Microalbumin / creatinine urine ratio -     Tirzepatide; Inject 10 mg into the skin once a week.  Dispense: 6 mL; Refill: 1  Mixed hyperlipidemia Assessment & Plan: Chronic.  Controlled.  Continue lipitor 20mg   Orders: -     Lipid panel -     Atorvastatin Calcium; Take 1 tablet (20 mg total) by mouth daily.  Dispense: 90 tablet; Refill: 1  Vitamin B12 deficiency Assessment & Plan: Chronic.  Taking supplements.  Check labs  Orders: -     Vitamin B12  Screening for viral disease -     Hepatitis C antibody -     HIV Antibody (routine testing w rflx)   Wellness-anticipatory guidance.  Work on Diet/Exercise  Check CBC,CMP,lipids,TSH, A1C.  F/u 1 yr  Fatigue, etc.  ? From thyroid changes, ozempic, hormones, other-check labs.   Recommended follow up: Return in about 3 months (around 09/23/2023) for cholesterol, DM.  Lab/Order associations:+ fasting  Angelena Sole, MD

## 2023-06-23 NOTE — Assessment & Plan Note (Signed)
Chronic.  Taking supplements.  Check labs

## 2023-06-24 ENCOUNTER — Other Ambulatory Visit: Payer: Self-pay | Admitting: Family Medicine

## 2023-06-24 LAB — HIV ANTIBODY (ROUTINE TESTING W REFLEX): HIV 1&2 Ab, 4th Generation: NONREACTIVE

## 2023-06-24 LAB — ESTRADIOL: Estradiol: <15 pg/mL

## 2023-06-24 LAB — HEPATITIS C ANTIBODY: Hepatitis C Ab: NONREACTIVE

## 2023-06-24 MED ORDER — ESTRADIOL 1 MG PO TABS
1.0000 mg | ORAL_TABLET | Freq: Every day | ORAL | 1 refills | Status: DC
  Filled 2023-06-24: qty 90, 90d supply, fill #0
  Filled 2023-09-22: qty 90, 90d supply, fill #1

## 2023-06-24 NOTE — Progress Notes (Signed)
Spoke w/pt.  Did get the mounjaro Thyroid alternate days with 1/2 tab then whole tab A1C normal.  Hopefully change to mounjaro will help how feeling.  May need to eat a little more. Estrogen very low and pt not feeling good.  Will restart estradilol 1mg  daily.

## 2023-06-25 ENCOUNTER — Other Ambulatory Visit: Payer: Self-pay

## 2023-06-25 ENCOUNTER — Other Ambulatory Visit (HOSPITAL_COMMUNITY): Payer: Self-pay

## 2023-08-18 DIAGNOSIS — Z01419 Encounter for gynecological examination (general) (routine) without abnormal findings: Secondary | ICD-10-CM | POA: Diagnosis not present

## 2023-08-18 DIAGNOSIS — Z1231 Encounter for screening mammogram for malignant neoplasm of breast: Secondary | ICD-10-CM | POA: Diagnosis not present

## 2023-08-18 DIAGNOSIS — Z6830 Body mass index (BMI) 30.0-30.9, adult: Secondary | ICD-10-CM | POA: Diagnosis not present

## 2023-08-31 ENCOUNTER — Other Ambulatory Visit: Payer: Self-pay | Admitting: Family Medicine

## 2023-08-31 ENCOUNTER — Other Ambulatory Visit (HOSPITAL_COMMUNITY): Payer: Self-pay

## 2023-08-31 ENCOUNTER — Other Ambulatory Visit: Payer: Self-pay

## 2023-08-31 MED ORDER — LEVOTHYROXINE SODIUM 150 MCG PO TABS
150.0000 ug | ORAL_TABLET | Freq: Every day | ORAL | 3 refills | Status: DC
  Filled 2023-08-31: qty 90, 90d supply, fill #0
  Filled 2023-12-27: qty 90, 90d supply, fill #1
  Filled 2024-03-29: qty 90, 90d supply, fill #2
  Filled 2024-07-03: qty 90, 90d supply, fill #3

## 2023-09-10 ENCOUNTER — Other Ambulatory Visit (HOSPITAL_COMMUNITY): Payer: Self-pay

## 2023-09-22 ENCOUNTER — Other Ambulatory Visit: Payer: Self-pay

## 2023-10-11 NOTE — Progress Notes (Signed)
Subjective:     Patient ID: Daisy Ramirez, female    DOB: [birth year], 52 y.o.   MRN: [id number]  No chief complaint on file.   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   Sinusitis 09/05/2015   Synovitis of right knee 11/11/2017   UTI (lower urinary tract infection)     Past Surgical History:  Procedure Laterality Date   ABDOMINAL HYSTERECTOMY  11/16/2012   Procedure: HYSTERECTOMY ABDOMINAL;  Surgeon: Jeani Hawking, MD;  Location: [facility];  Service: Gynecology;  Laterality: N/A;   bilateral knee scope  1997   CHONDROPLASTY Right 11/16/2017   Procedure: CHONDROPLASTY;  Surgeon: Salvatore Marvel, MD;  Location: [facility];  Service: Orthopedics;  Laterality: Right;   KNEE ARTHROSCOPY WITH LATERAL MENISECTOMY Right 11/16/2017   Procedure: ARTHROSCOPY RIGHT KNEE WITH SYNOVECTOMY, CHONDROPLASTY, PARTIAL LATERAL MENISECTOMY;  Surgeon: Salvatore Marvel, MD;  Location: [facility];  Service: Orthopedics;  Laterality: Right;   KNEE SURGERY     x 2   KNEE SURGERY     bilateral    SALPINGOOPHORECTOMY  11/16/2012   Procedure: SALPINGO OOPHORECTOMY;  Surgeon: Jeani Hawking, MD;  Location: [facility];  Service: Gynecology;  Laterality: Bilateral;     Current Outpatient Medications:    atorvastatin (LIPITOR) 20 MG tablet, Take 1 tablet (20 mg total) by mouth daily., Disp: 90 tablet, Rfl: 1   estradiol (ESTRACE) 1 MG tablet, Take 1 tablet (1 mg total) by mouth daily., Disp: 90 tablet, Rfl: 1   levothyroxine (SYNTHROID) 150 MCG tablet, Take 1 tablet (150 mcg total) by mouth daily before breakfast., Disp: 90 tablet, Rfl: 3   Omega-3 Fatty Acids (FISH OIL PO), Take 1 capsule by mouth daily., Disp: , Rfl:    omeprazole (PRILOSEC) 40 MG capsule, Take 1 capsule (40 mg total) by mouth daily., Disp: 90 capsule, Rfl: 1   tirzepatide (MOUNJARO) 10 MG/0.5ML Pen, Inject 10 mg into the skin once a week., Disp: 6 mL, Rfl: 1   vitamin B-12 (CYANOCOBALAMIN) 500 MCG tablet, Take 500 mcg by mouth daily., Disp: , Rfl:    VITAMIN D PO, Take 1 capsule by mouth daily., Disp: , Rfl:   No Known Allergies ROS neg/noncontributory except as noted HPI/below      Objective:     LMP 10/18/2012  Wt Readings from Last 3 Encounters:  06/23/23 188 lb 4 oz (85.4 kg)  03/23/23 191  lb 4 oz (86.8 kg)  11/09/22 215 lb 2 oz (97.6 kg)    Physical Exam   Gen: WDWN NAD HEENT: NCAT, conjunctiva not injected, sclera nonicteric NECK:  supple, no thyromegaly, no nodes, no carotid bruits CARDIAC: RRR, S1S2+, no murmur. DP 2+B LUNGS: CTAB. No wheezes ABDOMEN:  BS+, soft, NTND, No HSM, no masses EXT:  no edema MSK: no gross abnormalities.  NEURO: A&O x3.  CN II-XII intact.  PSYCH: normal mood. Good eye contact     Assessment & Plan:  There are no diagnoses linked to this encounter.  No follow-ups on file.    I, Isabelle Course, acting as a scribe for Angelena Sole, MD., have documented all relevant documentation on the behalf of Angelena Sole, MD, as directed by  Angelena Sole, MD while in the presence  of Angelena Sole, MD.  I, Isabelle Course, have reviewed all documentation for this visit. The documentation on 10/11/23 for the exam, diagnosis, procedures, and orders are all accurate and complete.  ***   Isabelle Course

## 2023-10-12 ENCOUNTER — Encounter: Payer: Self-pay | Admitting: Family Medicine

## 2023-10-12 ENCOUNTER — Other Ambulatory Visit (HOSPITAL_COMMUNITY): Payer: Self-pay

## 2023-10-12 ENCOUNTER — Ambulatory Visit (INDEPENDENT_AMBULATORY_CARE_PROVIDER_SITE_OTHER): Payer: Commercial Managed Care - PPO | Admitting: Family Medicine

## 2023-10-12 ENCOUNTER — Other Ambulatory Visit (HOSPITAL_BASED_OUTPATIENT_CLINIC_OR_DEPARTMENT_OTHER): Payer: Self-pay

## 2023-10-12 VITALS — BP 124/92 | HR 78 | Temp 98.1°F | Resp 18 | Ht 67.0 in | Wt 187.0 lb

## 2023-10-12 DIAGNOSIS — E782 Mixed hyperlipidemia: Secondary | ICD-10-CM

## 2023-10-12 DIAGNOSIS — E119 Type 2 diabetes mellitus without complications: Secondary | ICD-10-CM | POA: Diagnosis not present

## 2023-10-12 DIAGNOSIS — Z7985 Long-term (current) use of injectable non-insulin antidiabetic drugs: Secondary | ICD-10-CM | POA: Diagnosis not present

## 2023-10-12 DIAGNOSIS — Z800 Family history of malignant neoplasm of digestive organs: Secondary | ICD-10-CM | POA: Insufficient documentation

## 2023-10-12 DIAGNOSIS — E063 Autoimmune thyroiditis: Secondary | ICD-10-CM | POA: Diagnosis not present

## 2023-10-12 DIAGNOSIS — E538 Deficiency of other specified B group vitamins: Secondary | ICD-10-CM | POA: Diagnosis not present

## 2023-10-12 DIAGNOSIS — Z79890 Hormone replacement therapy (postmenopausal): Secondary | ICD-10-CM | POA: Diagnosis not present

## 2023-10-12 DIAGNOSIS — R5383 Other fatigue: Secondary | ICD-10-CM

## 2023-10-12 DIAGNOSIS — R1084 Generalized abdominal pain: Secondary | ICD-10-CM

## 2023-10-12 DIAGNOSIS — Z5181 Encounter for therapeutic drug level monitoring: Secondary | ICD-10-CM | POA: Diagnosis not present

## 2023-10-12 LAB — COMPREHENSIVE METABOLIC PANEL
ALT: 28 U/L (ref 0–35)
AST: 19 U/L (ref 0–37)
Albumin: 4.7 g/dL (ref 3.5–5.2)
Alkaline Phosphatase: 76 U/L (ref 39–117)
BUN: 11 mg/dL (ref 6–23)
CO2: 24 meq/L (ref 19–32)
Calcium: 9.7 mg/dL (ref 8.4–10.5)
Chloride: 107 meq/L (ref 96–112)
Creatinine, Ser: 0.78 mg/dL (ref 0.40–1.20)
GFR: 87.57 mL/min (ref >60.00)
Glucose, Bld: 84 mg/dL (ref 70–99)
Potassium: 4.3 meq/L (ref 3.5–5.1)
Sodium: 139 meq/L (ref 135–145)
Total Bilirubin: 0.8 mg/dL (ref 0.2–1.2)
Total Protein: 7.5 g/dL (ref 6.0–8.3)

## 2023-10-12 LAB — CBC WITH DIFFERENTIAL/PLATELET
Basophils Absolute: 0 10*3/uL (ref 0.0–0.1)
Basophils Relative: 0.7 % (ref 0.0–3.0)
Eosinophils Absolute: 0 10*3/uL (ref 0.0–0.7)
Eosinophils Relative: 0.5 % (ref 0.0–5.0)
HCT: 47.2 % — ABNORMAL HIGH (ref 36.0–46.0)
Hemoglobin: 15.4 g/dL — ABNORMAL HIGH (ref 12.0–15.0)
Lymphocytes Relative: 30.9 % (ref 12.0–46.0)
Lymphs Abs: 1.4 10*3/uL (ref 0.7–4.0)
MCHC: 32.6 g/dL (ref 30.0–36.0)
MCV: 89.4 fL (ref 78.0–100.0)
Monocytes Absolute: 0.3 10*3/uL (ref 0.1–1.0)
Monocytes Relative: 5.8 % (ref 3.0–12.0)
Neutro Abs: 2.9 10*3/uL (ref 1.4–7.7)
Neutrophils Relative %: 62.1 % (ref 43.0–77.0)
Platelets: 309 10*3/uL (ref 150.0–400.0)
RBC: 5.28 Mil/uL — ABNORMAL HIGH (ref 3.87–5.11)
RDW: 13.6 % (ref 11.5–15.5)
WBC: 4.7 10*3/uL (ref 4.0–10.5)

## 2023-10-12 LAB — TSH: TSH: 2.9 u[IU]/mL (ref 0.35–5.50)

## 2023-10-12 MED ORDER — ONDANSETRON HCL 4 MG PO TABS
4.0000 mg | ORAL_TABLET | Freq: Three times a day (TID) | ORAL | 1 refills | Status: AC | PRN
  Filled 2023-10-12: qty 20, 7d supply, fill #0

## 2023-10-12 MED ORDER — AMOXICILLIN-POT CLAVULANATE 875-125 MG PO TABS
1.0000 | ORAL_TABLET | Freq: Two times a day (BID) | ORAL | 0 refills | Status: DC
  Filled 2023-10-12: qty 20, 10d supply, fill #0

## 2023-10-12 MED ORDER — ATORVASTATIN CALCIUM 20 MG PO TABS
20.0000 mg | ORAL_TABLET | Freq: Every day | ORAL | 3 refills | Status: DC
  Filled 2023-10-12 – 2023-12-02 (×2): qty 90, 90d supply, fill #0
  Filled 2024-03-29: qty 90, 90d supply, fill #1
  Filled 2024-07-03: qty 90, 90d supply, fill #2

## 2023-10-12 MED ORDER — TIRZEPATIDE 12.5 MG/0.5ML ~~LOC~~ SOAJ
12.5000 mg | SUBCUTANEOUS | 1 refills | Status: DC
Start: 2023-10-12 — End: 2024-04-11
  Filled 2023-10-12 – 2023-10-27 (×2): qty 6, 84d supply, fill #0
  Filled 2023-12-02: qty 2, 28d supply, fill #0
  Filled 2023-12-27: qty 2, 28d supply, fill #1
  Filled 2024-01-24: qty 2, 28d supply, fill #2
  Filled 2024-02-24: qty 2, 28d supply, fill #3
  Filled 2024-03-29: qty 2, 28d supply, fill #4

## 2023-10-12 MED ORDER — ESTRADIOL 1 MG PO TABS
1.0000 mg | ORAL_TABLET | Freq: Every day | ORAL | 3 refills | Status: DC
  Filled 2023-10-12 – 2023-12-27 (×2): qty 90, 90d supply, fill #0
  Filled 2024-03-29: qty 90, 90d supply, fill #1

## 2023-10-12 NOTE — Assessment & Plan Note (Addendum)
Chronic.  Controlled on  10mg  mounjaro but weight has plateaued.  Will increase to 12.5 mg weekly..  Get eye exam done

## 2023-10-12 NOTE — Patient Instructions (Addendum)
It was very nice to see you today!  Take omeprazole daily-minimum 1 week-twice/day Sent the zofran for nausea.  ER if severe.  Keep an eye on bp's.    PLEASE NOTE:  If you had any lab tests please let us know if you have not heard back within a few days. You may see your results on MyChart before we have a chance to review them but we will give you a call once they are reviewed by [place]. If we ordered any referrals today, please let us know if you have not heard from their office within the next week.   Please try these tips to maintain a healthy lifestyle:  Eat most of your calories during the day when you are active. Eliminate processed foods including packaged sweets (pies, cakes, cookies), reduce intake of potatoes, white bread, white pasta, and white rice. Look for whole grain options, oat flour or almond flour.  Each meal should contain half fruits/vegetables, one quarter protein, and one quarter carbs (no bigger than a computer mouse).  Cut down on sweet beverages. This includes juice, soda, and sweet tea. Also watch fruit intake, though this is a healthier sweet option, it still contains natural sugar! Limit to 3 servings daily.  Drink at least 1 glass of water with each meal and aim for at least 8 glasses per day  Exercise at least 150 minutes every week.

## 2023-10-12 NOTE — Assessment & Plan Note (Signed)
Chronic.  Taking supplements.  controlled

## 2023-10-12 NOTE — Assessment & Plan Note (Signed)
Chronic.  Over-controlled.  Changed synthroid to alt w/75.  Reck labs

## 2023-10-12 NOTE — Assessment & Plan Note (Signed)
Chronic.  Not well-controlled on estradiol 1 mg daily.  However, due to family history of breast cancer, she is hesitant to increase.  She will continue for another couple of months and then decide whether to continue

## 2023-10-12 NOTE — Progress Notes (Signed)
Labs are okay except-thyroid dose may be a little undercorrected.  We can either change the dose to 125 daily and recheck in 2 months, or she can do the 150 mcg5 days a week and half of the tab 2 days of the week and repeat in 2 months Also, hgb slightly up-not sure if nausea or something else.  Repeat cbcd w/the tsh in 2 months

## 2023-10-12 NOTE — Assessment & Plan Note (Signed)
Chronic.  Controlled.  Continue lipitor 20mg 

## 2023-10-13 ENCOUNTER — Other Ambulatory Visit: Payer: Self-pay | Admitting: *Deleted

## 2023-10-13 DIAGNOSIS — E063 Autoimmune thyroiditis: Secondary | ICD-10-CM

## 2023-10-13 DIAGNOSIS — D582 Other hemoglobinopathies: Secondary | ICD-10-CM

## 2023-10-14 ENCOUNTER — Other Ambulatory Visit: Payer: Self-pay

## 2023-10-27 ENCOUNTER — Other Ambulatory Visit: Payer: Self-pay | Admitting: Family Medicine

## 2023-10-27 MED ORDER — OMEPRAZOLE 40 MG PO CPDR
40.0000 mg | DELAYED_RELEASE_CAPSULE | Freq: Every day | ORAL | 1 refills | Status: DC
  Filled 2023-10-27: qty 90, 90d supply, fill #0
  Filled 2024-01-24: qty 90, 90d supply, fill #1

## 2023-10-28 ENCOUNTER — Other Ambulatory Visit: Payer: Self-pay

## 2023-12-02 ENCOUNTER — Other Ambulatory Visit (HOSPITAL_COMMUNITY): Payer: Self-pay

## 2023-12-02 ENCOUNTER — Other Ambulatory Visit: Payer: Self-pay

## 2023-12-10 ENCOUNTER — Other Ambulatory Visit: Payer: Commercial Managed Care - PPO

## 2023-12-15 ENCOUNTER — Other Ambulatory Visit (INDEPENDENT_AMBULATORY_CARE_PROVIDER_SITE_OTHER): Payer: Commercial Managed Care - PPO

## 2023-12-15 DIAGNOSIS — E063 Autoimmune thyroiditis: Secondary | ICD-10-CM

## 2023-12-15 DIAGNOSIS — D582 Other hemoglobinopathies: Secondary | ICD-10-CM

## 2023-12-15 LAB — CBC WITH DIFFERENTIAL/PLATELET
Basophils Absolute: 0 10*3/uL (ref 0.0–0.1)
Basophils Relative: 0.6 % (ref 0.0–3.0)
Eosinophils Absolute: 0 10*3/uL (ref 0.0–0.7)
Eosinophils Relative: 0.9 % (ref 0.0–5.0)
HCT: 44.8 % (ref 36.0–46.0)
Hemoglobin: 15.1 g/dL — ABNORMAL HIGH (ref 12.0–15.0)
Lymphocytes Relative: 29.2 % (ref 12.0–46.0)
Lymphs Abs: 1.4 10*3/uL (ref 0.7–4.0)
MCHC: 33.8 g/dL (ref 30.0–36.0)
MCV: 88.9 fL (ref 78.0–100.0)
Monocytes Absolute: 0.3 10*3/uL (ref 0.1–1.0)
Monocytes Relative: 6 % (ref 3.0–12.0)
Neutro Abs: 3.1 10*3/uL (ref 1.4–7.7)
Neutrophils Relative %: 63.3 % (ref 43.0–77.0)
Platelets: 302 10*3/uL (ref 150.0–400.0)
RBC: 5.03 Mil/uL (ref 3.87–5.11)
RDW: 13.2 % (ref 11.5–15.5)
WBC: 5 10*3/uL (ref 4.0–10.5)

## 2023-12-15 LAB — TSH: TSH: 2.11 u[IU]/mL (ref 0.35–5.50)

## 2023-12-15 NOTE — Progress Notes (Signed)
Thyroid-same dose.  Hgb stable.  Will monitor

## 2023-12-27 ENCOUNTER — Other Ambulatory Visit: Payer: Self-pay

## 2023-12-27 ENCOUNTER — Other Ambulatory Visit (HOSPITAL_COMMUNITY): Payer: Self-pay

## 2024-01-24 ENCOUNTER — Encounter: Payer: Self-pay | Admitting: Pharmacist

## 2024-01-24 ENCOUNTER — Other Ambulatory Visit: Payer: Self-pay

## 2024-01-26 ENCOUNTER — Other Ambulatory Visit (HOSPITAL_COMMUNITY): Payer: Self-pay

## 2024-02-24 ENCOUNTER — Other Ambulatory Visit: Payer: Self-pay

## 2024-03-29 ENCOUNTER — Other Ambulatory Visit (HOSPITAL_COMMUNITY): Payer: Self-pay

## 2024-03-29 ENCOUNTER — Other Ambulatory Visit: Payer: Self-pay

## 2024-04-11 ENCOUNTER — Other Ambulatory Visit: Payer: Self-pay

## 2024-04-11 ENCOUNTER — Encounter: Payer: Self-pay | Admitting: Family Medicine

## 2024-04-11 ENCOUNTER — Other Ambulatory Visit (HOSPITAL_COMMUNITY): Payer: Self-pay

## 2024-04-11 ENCOUNTER — Ambulatory Visit: Payer: Commercial Managed Care - PPO | Admitting: Family Medicine

## 2024-04-11 VITALS — BP 121/82 | HR 72 | Temp 98.1°F | Ht 67.0 in | Wt 187.0 lb

## 2024-04-11 DIAGNOSIS — E119 Type 2 diabetes mellitus without complications: Secondary | ICD-10-CM | POA: Diagnosis not present

## 2024-04-11 DIAGNOSIS — E782 Mixed hyperlipidemia: Secondary | ICD-10-CM

## 2024-04-11 DIAGNOSIS — Z7985 Long-term (current) use of injectable non-insulin antidiabetic drugs: Secondary | ICD-10-CM

## 2024-04-11 DIAGNOSIS — E063 Autoimmune thyroiditis: Secondary | ICD-10-CM | POA: Diagnosis not present

## 2024-04-11 DIAGNOSIS — Z23 Encounter for immunization: Secondary | ICD-10-CM | POA: Diagnosis not present

## 2024-04-11 DIAGNOSIS — E282 Polycystic ovarian syndrome: Secondary | ICD-10-CM | POA: Diagnosis not present

## 2024-04-11 DIAGNOSIS — E538 Deficiency of other specified B group vitamins: Secondary | ICD-10-CM

## 2024-04-11 LAB — LIPID PANEL
Cholesterol: 176 mg/dL (ref 0–200)
HDL: 52.7 mg/dL (ref >39.00)
LDL Cholesterol: 88 mg/dL (ref 0–99)
NonHDL: 123.03
Total CHOL/HDL Ratio: 3
Triglycerides: 175 mg/dL — ABNORMAL HIGH (ref 0.0–149.0)
VLDL: 35 mg/dL (ref 0.0–40.0)

## 2024-04-11 LAB — COMPREHENSIVE METABOLIC PANEL WITH GFR
ALT: 22 U/L (ref 0–35)
AST: 16 U/L (ref 0–37)
Albumin: 4.8 g/dL (ref 3.5–5.2)
Alkaline Phosphatase: 68 U/L (ref 39–117)
BUN: 15 mg/dL (ref 6–23)
CO2: 25 meq/L (ref 19–32)
Calcium: 9.7 mg/dL (ref 8.4–10.5)
Chloride: 105 meq/L (ref 96–112)
Creatinine, Ser: 0.87 mg/dL (ref 0.40–1.20)
GFR: 76.55 mL/min (ref >60.00)
Glucose, Bld: 87 mg/dL (ref 70–99)
Potassium: 4.2 meq/L (ref 3.5–5.1)
Sodium: 138 meq/L (ref 135–145)
Total Bilirubin: 0.7 mg/dL (ref 0.2–1.2)
Total Protein: 7.4 g/dL (ref 6.0–8.3)

## 2024-04-11 LAB — CBC WITH DIFFERENTIAL/PLATELET
Basophils Absolute: 0 10*3/uL (ref 0.0–0.1)
Basophils Relative: 0.6 % (ref 0.0–3.0)
Eosinophils Absolute: 0.1 10*3/uL (ref 0.0–0.7)
Eosinophils Relative: 0.9 % (ref 0.0–5.0)
HCT: 45.5 % (ref 36.0–46.0)
Hemoglobin: 15.6 g/dL — ABNORMAL HIGH (ref 12.0–15.0)
Lymphocytes Relative: 28.9 % (ref 12.0–46.0)
Lymphs Abs: 1.7 10*3/uL (ref 0.7–4.0)
MCHC: 34.2 g/dL (ref 30.0–36.0)
MCV: 88.9 fl (ref 78.0–100.0)
Monocytes Absolute: 0.4 10*3/uL (ref 0.1–1.0)
Monocytes Relative: 6.8 % (ref 3.0–12.0)
Neutro Abs: 3.7 10*3/uL (ref 1.4–7.7)
Neutrophils Relative %: 62.8 % (ref 43.0–77.0)
Platelets: 297 10*3/uL (ref 150.0–400.0)
RBC: 5.12 Mil/uL — ABNORMAL HIGH (ref 3.87–5.11)
RDW: 13.3 % (ref 11.5–15.5)
WBC: 5.8 10*3/uL (ref 4.0–10.5)

## 2024-04-11 LAB — HEMOGLOBIN A1C: Hgb A1c MFr Bld: 5.2 % (ref 4.6–6.5)

## 2024-04-11 LAB — TSH: TSH: 2.61 u[IU]/mL (ref 0.35–5.50)

## 2024-04-11 LAB — VITAMIN B12: Vitamin B-12: 1146 pg/mL — ABNORMAL HIGH (ref 211–911)

## 2024-04-11 MED ORDER — OMEPRAZOLE 40 MG PO CPDR
40.0000 mg | DELAYED_RELEASE_CAPSULE | Freq: Every day | ORAL | 1 refills | Status: DC
  Filled 2024-04-11: qty 90, 90d supply, fill #0
  Filled 2024-07-20: qty 90, 90d supply, fill #1

## 2024-04-11 MED ORDER — TIRZEPATIDE 12.5 MG/0.5ML ~~LOC~~ SOAJ
12.5000 mg | SUBCUTANEOUS | 1 refills | Status: DC
  Filled 2024-04-11 – 2024-05-01 (×2): qty 6, 84d supply, fill #0
  Filled 2024-07-20: qty 6, 84d supply, fill #1

## 2024-04-11 NOTE — Patient Instructions (Signed)

## 2024-04-11 NOTE — Progress Notes (Signed)
 Subjective:     Patient ID: Daisy Ramirez, female    DOB: 1971-03-18, 53 y.o.   MRN: [id number]  Chief Complaint  Patient presents with   Follow-up    Pt here for 6 month follow up without any concerns    Hypertension   Diabetes    HPI  DM, type 2 - Pt is on Mounjaro 10 mg once weekly. Hasn't had her eye exam yet. Doing well.    Hashimoto's - on synthroid 150 mcg daily-no problems.   Mixed HLD-on lipitor 20mg -no SE ERT-on estradiol 1mg .   Discussed the use of AI scribe software for clinical note transcription with the patient, who gave verbal consent to proceed.  History of Present Illness Daisy Ramirez is a 53 year old female with diabetes who presents for a follow-up visit.  She manages her diabetes with Mounjaro 12.5 mg weekly. She notes a plateau in weight loss, partly due to decreased physical activity from weather conditions. As the weather improves, she has resumed walking in her backyard, engaging in a 'treasure hunt' for glass pieces. She acknowledges the need to improve her diet and exercise regimen.  She has Hashimoto's thyroiditis and takes Synthroid 150 mcg. She occasionally forgets her medication, leading to episodes of brain fog. She will set reminders to improve adherence.  She has a history of PCOS and takes estradiol 1 mg but reports no noticeable benefits in terms of hair loss, facial hair, mood, or weight. She is concerned about her family history of breast cancer, with a second cousin and her sister having had the disease.  She takes omeprazole for stomach issues and notes that missing doses leads to significant discomfort, including diarrhea and abdominal pain, especially after consuming spicy foods. She describes these episodes as 'absolutely miserable' and likens them to IBS symptoms. No vomiting or constipation.  She is on atorvastatin 20 mg for cholesterol management and reports that her cholesterol levels have normalized. She is not currently  taking omega-3 fish oils, as she forgets to take them, and is awaiting blood work results to determine if they are necessary.  She reports stress related to her husband's health issues, including his atrial fibrillation and drinking habits, as well as work-related stress. No depressive symptoms but acknowledges the stress impact on her life.   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   Sinusitis 09/05/2015   Synovitis of right knee 11/11/2017   UTI (lower urinary tract infection)     Past Surgical History:  Procedure Laterality Date   ABDOMINAL HYSTERECTOMY  11/16/2012   Procedure: HYSTERECTOMY ABDOMINAL;  Surgeon: Jeani Hawking, MD;  Location: [facility];  Service: Gynecology;  Laterality: N/A;   bilateral knee scope  1997   CHONDROPLASTY Right 11/16/2017   Procedure: CHONDROPLASTY;  Surgeon: Salvatore Marvel, MD;  Location: [facility];  Service: Orthopedics;  Laterality: Right;   KNEE ARTHROSCOPY WITH LATERAL MENISECTOMY Right 11/16/2017   Procedure: ARTHROSCOPY RIGHT KNEE WITH SYNOVECTOMY, CHONDROPLASTY, PARTIAL LATERAL MENISECTOMY;  Surgeon: Salvatore Marvel, MD;  Location: [facility];  Service: Orthopedics;  Laterality:  Right;   KNEE SURGERY     x 2   KNEE SURGERY     bilateral   SALPINGOOPHORECTOMY  11/16/2012   Procedure: SALPINGO OOPHORECTOMY;  Surgeon: Jeani Hawking, MD;  Location: [facility];  Service: Gynecology;  Laterality: Bilateral;     Current Outpatient Medications:    atorvastatin (LIPITOR) 20 MG tablet, Take 1 tablet (20 mg total) by mouth daily., Disp: 90 tablet, Rfl: 3   estradiol (ESTRACE) 1 MG tablet, Take 1 tablet (1 mg total) by mouth daily., Disp: 90 tablet, Rfl: 3   levothyroxine (SYNTHROID) 150 MCG tablet, Take 1 tablet (150 mcg total) by mouth daily before breakfast., Disp: 90 tablet, Rfl: 3   omeprazole (PRILOSEC) 40 MG capsule, Take 1 capsule (40 mg total) by mouth daily., Disp: 90 capsule, Rfl: 1   ondansetron (ZOFRAN) 4 MG tablet, Take 1 tablet (4 mg total) by mouth every 8 (eight) hours as needed for nausea or vomiting., Disp: 20 tablet, Rfl: 1   tirzepatide (MOUNJARO) 12.5 MG/0.5ML Pen, Inject 12.5 mg into the skin once a week., Disp: 6 mL, Rfl: 1   vitamin B-12 (CYANOCOBALAMIN) 500 MCG tablet, Take 500 mcg by mouth daily., Disp: , Rfl:    VITAMIN D PO, Take 1 capsule by mouth daily., Disp: , Rfl:    Omega-3 Fatty Acids (FISH OIL PO), Take 1 capsule by mouth daily. (Patient not taking: Reported on 04/11/2024), Disp: , Rfl:   No Known Allergies ROS neg/noncontributory except as noted HPI/below      Objective:     BP 121/82   Pulse 72   Temp 98.1 F (36.7 C)   Ht 5\' 7"  (1.702 m)   Wt 187 lb (84.8 kg)   LMP 10/18/2012   SpO2 100%   BMI 29.29 kg/m  Wt Readings from Last 3 Encounters:  04/11/24 187 lb (84.8 kg)  10/12/23 187 lb (84.8 kg)  06/23/23 188 lb 4 oz (85.4 kg)    Physical Exam   Gen: WDWN NAD.   HEENT: NCAT, conjunctiva not injected, sclera nonicteric. OP moist, no exudates NECK:  supple, no thyromegaly, no nodes, no carotid bruits.   CARDIAC: RRR, S1S2+, no murmur.  LUNGS: CTAB. No wheezes ABDOMEN:  BS+, soft, No HSM, no masses. EXT:  no  edema MSK: no gross abnormalities.  NEURO: A&O x3.  CN II-XII intact.  PSYCH: normal mood. Good eye contact  Diabetic Foot Exam - Simple   Simple Foot Form Diabetic Foot exam was performed with the following findings: Yes 04/11/2024  8:10 AM  Visual Inspection No deformities, no ulcerations, no other skin breakdown bilaterally: Yes Sensation Testing Intact to touch and monofilament testing bilaterally: Yes Pulse Check Posterior Tibialis and Dorsalis pulse intact bilaterally: Yes Comments          Assessment & Plan:  Type 2 diabetes mellitus without complication, without long-term current use of insulin (HCC)  HASHIMOTO'S THYROIDITIS  Mixed hyperlipidemia  PCOS (polycystic ovarian syndrome)  Vitamin B12 deficiency  Need for pneumococcal 20-valent conjugate vaccination  Long-term current use of injectable noninsulin antidiabetic medication  Assessment and Plan Assessment & Plan Type 2 Diabetes Mellitus   She is currently on Mounjaro 12.5 mg weekly and reports a plateau in weight loss with reduced exercise due to weather. Emphasized the importance of diet and exercise, and she prefers to focus on these improvements before increasing Mounjaro. Mounjaro offers liver protection and potential kidney protection, though not indicated for the latter. Encourage regular exercise and dietary modifications. Reassess weight and diabetes management in 3 months. Consider increasing Mounjaro to 15 mg if no improvement in weight and diabetes control.  Hashimoto's Thyroiditis   She is on Synthroid 150 mcg and reports occasional brain fog due to missed doses. Suggested setting a daily reminder for adherence.  Hyperlipidemia   She is on atorvastatin 20 mg with normalized cholesterol levels. Will review current blood work to assess the necessity of omega-3 supplementation.  Gastroesophageal Reflux Disease (GERD)   She takes omeprazole and reports symptom exacerbation with spicy foods, causing  diarrhea and abdominal pain. Suggested increasing omeprazole to twice daily during flare-ups and avoiding trigger foods.  Hormone Replacement Therapy Concerns   She is on estradiol 1 mg with no benefits and concerns about family history of breast cancer. Agreed to discontinue due to lack of benefits and potential risks.  Pcos-intol metformin.  S/p hyst.  On mounjaro.   General Health Maintenance   She requires eye and foot exams. Emphasized regular screenings and monitoring for diabetes-related complications. Schedule and complete an eye exam. Perform a foot exam during the visit.  Follow-up   Schedule a follow-up visit in 3 months to reassess diabetes management and weight.     Return in about 6 months (around 10/11/2024) for chronic follow-up.  2-3 mo CPE.      Ellsworth Haas, MD

## 2024-04-11 NOTE — Progress Notes (Signed)
 Labs great/at goal x 1.  Hgb borderline high.  Will continue to monitor 2.  Great job on sugars!!! 3.  Trigs are a little high-take 1 fish oil/day and of course, work more on diet/exercise

## 2024-04-12 ENCOUNTER — Other Ambulatory Visit: Payer: Self-pay

## 2024-05-01 ENCOUNTER — Other Ambulatory Visit: Payer: Self-pay

## 2024-07-03 ENCOUNTER — Other Ambulatory Visit (HOSPITAL_COMMUNITY): Payer: Self-pay

## 2024-07-20 ENCOUNTER — Other Ambulatory Visit (HOSPITAL_COMMUNITY): Payer: Self-pay

## 2024-07-20 ENCOUNTER — Other Ambulatory Visit: Payer: Self-pay

## 2024-08-17 ENCOUNTER — Other Ambulatory Visit (HOSPITAL_COMMUNITY): Payer: Self-pay

## 2024-08-17 ENCOUNTER — Ambulatory Visit: Admitting: Family Medicine

## 2024-08-17 ENCOUNTER — Other Ambulatory Visit: Payer: Self-pay

## 2024-08-17 ENCOUNTER — Encounter: Payer: Self-pay | Admitting: Family Medicine

## 2024-08-17 VITALS — BP 112/78 | HR 62 | Temp 98.1°F | Ht 61.0 in | Wt 183.2 lb

## 2024-08-17 DIAGNOSIS — F5101 Primary insomnia: Secondary | ICD-10-CM | POA: Diagnosis not present

## 2024-08-17 DIAGNOSIS — Z0000 Encounter for general adult medical examination without abnormal findings: Secondary | ICD-10-CM | POA: Diagnosis not present

## 2024-08-17 DIAGNOSIS — E669 Obesity, unspecified: Secondary | ICD-10-CM | POA: Diagnosis not present

## 2024-08-17 DIAGNOSIS — E119 Type 2 diabetes mellitus without complications: Secondary | ICD-10-CM

## 2024-08-17 DIAGNOSIS — Z7985 Long-term (current) use of injectable non-insulin antidiabetic drugs: Secondary | ICD-10-CM

## 2024-08-17 MED ORDER — TEMAZEPAM 15 MG PO CAPS
15.0000 mg | ORAL_CAPSULE | Freq: Every evening | ORAL | 1 refills | Status: AC | PRN
  Filled 2024-08-17 (×2): qty 30, 30d supply, fill #0
  Filled 2024-12-25: qty 30, 30d supply, fill #1

## 2024-08-17 MED ORDER — ESTRADIOL 1 MG PO TABS
1.0000 mg | ORAL_TABLET | Freq: Every day | ORAL | 3 refills | Status: AC
  Filled 2024-08-17 (×2): qty 90, 90d supply, fill #0
  Filled 2024-11-14: qty 90, 90d supply, fill #1

## 2024-08-17 MED ORDER — TIRZEPATIDE 12.5 MG/0.5ML ~~LOC~~ SOAJ
12.5000 mg | SUBCUTANEOUS | 1 refills | Status: DC
  Filled 2024-08-17: qty 6, 84d supply, fill #0

## 2024-08-17 NOTE — Progress Notes (Signed)
 Phone [phone]   Subjective:   Patient is a 53 y.o. female presenting for annual physical.    Chief Complaint  Patient presents with   Annual Exam   Annual-walking daily! Discussed the use of AI scribe software for clinical note transcription with the patient, who gave verbal consent to proceed.  History of Present Illness Daisy Ramirez is a 53 year old female who presents for an annual physical exam.  She has been experiencing challenges with weight loss, attributing difficulty to menopause, diabetes, and thyroid  issues. She is currently on Mounjaro  12.5 mg. She has a history of Hashimoto's thyroiditis and is taking Synthroid , with no current need for a refill.  She has COPD. She did not report shortness of breath, coughing, or wheezing.  She restarted estradiol  after experiencing significant fatigue upon discontinuation, which improved her energy levels. She is taking 1 mg of estradiol .  She has been experiencing sleep disturbances and has used temazepam  15 mg occasionally to aid sleep, especially during recent stress due to personal and family health issues. She reports increased stress due to recent personal events, including the euthanasia of two pets and her husband's health issues, which have impacted her sleep and stress levels.  No thoughts of suicide or self-harm. She engages in regular physical activity, walking over a mile daily with a friend, which she finds beneficial for stress relief.  No headaches, dizziness, syncope, chest pain, gastrointestinal symptoms, urinary issues, or new musculoskeletal pain beyond her known arthritis and spinal disc issues.  She recently had an eye exam and obtained new glasses, addressing a care gap.  She occasionally consumes THC/CBD drinks instead of alcohol.    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   Sinusitis 09/05/2015   Synovitis of right knee 11/11/2017   UTI (lower urinary tract infection)    Patient Active Problem List   Diagnosis Date Noted   Encounter for monitoring estrogen replacement therapy following surgical menopause 10/12/2023   Family history of colon cancer 10/12/2023   Mixed hyperlipidemia 11/09/2022   Primary insomnia 02/12/2022   Type 2 diabetes mellitus without complication, without long-term current use of insulin (HCC) 02/12/2022   Vitamin B12 deficiency 02/12/2022   PCOS (polycystic ovarian syndrome) 02/12/2022   Synovitis of right knee 11/11/2017   Chronic patellofemoral pain of right knee 11/11/2017   Pain of fifth  toe 02/14/2016   Sinusitis 09/05/2015   S/P total hysterectomy 09/05/2015   Obesity 09/05/2015   Arthritis    Hypothyroidism 07/18/2014   HASHIMOTO'S THYROIDITIS 04/17/2007   Allergic rhinitis 04/13/2007   Asthma 04/13/2007   Migraine 04/13/2007   Past Surgical  History:  Procedure Laterality Date   ABDOMINAL HYSTERECTOMY  11/16/2012   Procedure: HYSTERECTOMY ABDOMINAL;  Surgeon: Rosaline LITTIE Cobble, MD;  Location: [facility];  Service: Gynecology;  Laterality: N/A;   bilateral knee scope  1997   CHONDROPLASTY Right 11/16/2017   Procedure: CHONDROPLASTY;  Surgeon: Jane Charleston, MD;  Location: [facility];  Service: Orthopedics;  Laterality: Right;   KNEE ARTHROSCOPY WITH LATERAL MENISECTOMY Right 11/16/2017   Procedure: ARTHROSCOPY RIGHT KNEE WITH SYNOVECTOMY, CHONDROPLASTY, PARTIAL LATERAL MENISECTOMY;  Surgeon: Jane Charleston, MD;  Location: [facility];  Service: Orthopedics;  Laterality: Right;   KNEE SURGERY     x 2   KNEE SURGERY     bilateral   SALPINGOOPHORECTOMY  11/16/2012   Procedure: SALPINGO OOPHORECTOMY;  Surgeon: Rosaline LITTIE Cobble, MD;  Location: [facility];  Service: Gynecology;  Laterality: Bilateral;    Family History  Problem Relation Age of Onset   Arthritis Mother    Hypertension Mother    Arthritis Father    Hyperlipidemia Father    Colon cancer Brother    Alcohol abuse Paternal Aunt    Alcohol abuse Maternal Grandmother        liver failure   Heart disease Maternal Grandfather    Alcohol abuse Paternal Grandmother        liver failures   Heart disease Paternal Grandfather    Cancer Cousin        Breast Cancer   Breast cancer Cousin    Thyroid  disease Neg Hx     Medications- reviewed and updated Current Outpatient Medications  Medication Sig Dispense Refill   atorvastatin  (LIPITOR) 20 MG tablet Take 1 tablet (20 mg total) by mouth daily. 90 tablet 3   levothyroxine  (SYNTHROID ) 150 MCG tablet Take 1 tablet (150 mcg total) by mouth daily before breakfast. 90 tablet 3   Omega-3 Fatty Acids (FISH OIL PO) Take 1 capsule by mouth daily.     omeprazole  (PRILOSEC) 40 MG capsule Take 1 capsule (40 mg total) by mouth daily. 90 capsule 1   ondansetron  (ZOFRAN ) 4 MG tablet Take 1 tablet (4 mg total)  by mouth every 8 (eight) hours as needed for nausea or vomiting. 20 tablet 1   temazepam  (RESTORIL ) 15 MG capsule Take 1 capsule (15 mg total) by mouth at bedtime as needed for sleep. 30 capsule 1   vitamin B-12 (CYANOCOBALAMIN ) 500 MCG tablet Take 500 mcg by mouth daily.     VITAMIN D  PO Take 1 capsule by mouth daily.     estradiol  (ESTRACE ) 1 MG tablet Take 1 tablet (1 mg total) by mouth daily. 90 tablet 3   tirzepatide  (MOUNJARO ) 12.5 MG/0.5ML Pen Inject 12.5 mg into the skin once a week. 6 mL 1   No current facility-administered medications for this visit.    Allergies-reviewed and updated No Known Allergies  Social History   Social History Narrative   Patient lives at home w/ husbBrian. Patient have no children. Patient has a Chiropodist. Patient is left handed.    Objective  Objective:  BP 112/78 (BP Location: Left Arm, Patient Position: Sitting, Cuff Size: Normal)   Pulse 62   Temp 98.1 F (36.7 C) (Temporal)   Ht  5' 1 (1.549 m)   Wt 183 lb 3.2 oz (83.1 kg)   LMP 10/18/2012   SpO2 98%   BMI 34.62 kg/m  Physical Exam  Gen: WDWN NAD HEENT: NCAT, conjunctiva not injected, sclera nonicteric TM WNL B, OP moist, no exudates  NECK:  supple, no thyromegaly, no nodes, no carotid bruits CARDIAC: RRR, S1S2+, no murmur. DP 2+B LUNGS: CTAB. No wheezes ABDOMEN:  BS+, soft, NTND, No HSM, no masses EXT:  no edema MSK: no gross abnormalities. MS 5/5 all 4 NEURO: A&O x3.  CN II-XII intact.  PSYCH: normal mood. Good eye contact     Assessment and Plan   Health Maintenance counseling: 1. Anticipatory guidance: Patient counseled regarding regular dental exams q6 months, eye exams,  avoiding smoking and second hand smoke, limiting alcohol to 1 beverage per day, no illicit drugs.   2. Risk factor reduction:  Advised patient of need for regular exercise and diet rich and fruits and vegetables to reduce risk of heart attack and stroke. Exercise- + Wt Readings from Last 3  Encounters:  08/17/24 183 lb 3.2 oz (83.1 kg)  04/11/24 187 lb (84.8 kg)  10/12/23 187 lb (84.8 kg)   3. Immunizations/screenings/ancillary studies Immunization History  Administered Date(s) Administered   Influenza,inj,Quad PF,6+ Mos 11/14/2021   Influenza,inj,Quad PF,6-35 Mos 10/11/2022   Influenza-Unspecified 09/27/2013, 08/29/2015, 10/25/2016, 09/21/2023   PFIZER(Purple Top)SARS-COV-2 Vaccination 12/04/2019, 01/08/2020, 02/14/2020, 03/08/2020   PNEUMOCOCCAL CONJUGATE-20 04/11/2024   Pneumococcal Polysaccharide-23 11/17/2012   Pneumococcal-Unspecified 10/28/2012   Td 01/07/1999   Tdap 11/09/2022   Zoster Recombinant(Shingrix ) 11/09/2022, 03/23/2023   Health Maintenance Due  Topic Date Due   OPHTHALMOLOGY EXAM  Never done   Diabetic kidney evaluation - Urine ACR  Never done   Hepatitis B Vaccines 19-59 Average Risk (1 of 3 - 19+ 3-dose series) Never done    4. Cervical cancer screening- hyst 5. Breast cancer screening-  mammogram gyn does-utd 6. Colon cancer screening - due 12/09/25 7. Skin cancer screening- advised regular sunscreen use. Denies worrisome, changing, or new skin lesions.  8. Birth control/STD check- n/a 9. Osteoporosis screening- n/a 10. Smoking associated screening - non smoker  Wellness examination  Primary insomnia -     Temazepam ; Take 1 capsule (15 mg total) by mouth at bedtime as needed for sleep.  Dispense: 30 capsule; Refill: 1  Type 2 diabetes mellitus without complication, without long-term current use of insulin (HCC) -     Tirzepatide ; Inject 12.5 mg into the skin once a week.  Dispense: 6 mL; Refill: 1  Other orders -     Estradiol ; Take 1 tablet (1 mg total) by mouth daily.  Dispense: 90 tablet; Refill: 3   Annual-antic guidance Assessment and Plan Assessment & Plan Adult Wellness Visit   She maintains regular physical activity, walking over a mile daily. A recent eye examination was completed, and a mammogram is scheduled for next  month. Continue regular physical activity, ensure the mammogram is completed, and obtain a release and copy of the recent eye examination.  Obesity   Currently on Mounjaro  12.5 mg with a recent weight loss plateau. Maintain the current dose and consider increasing if weight loss stalls again.  Hashimoto's thyroiditis   On Synthroid  with an adequate supply. No new symptoms or issues related to thyroid  management.  Chronic obstructive pulmonary disease (COPD)   No acute symptoms such as shortness of breath, coughing, or wheezing.  Menopausal symptoms   Restarted estradiol  1 mg due to significant fatigue  and inability to function after stopping. Reports improved energy levels since resuming medication. Renew estradiol  1 mg prescription.  Insomnia   Difficulty sleeping due to recent stress. Temazepam  15 mg is used occasionally. Requests a prescription for future use, emphasizing non-nightly use. Prescribe temazepam  15 mg for occasional use.  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 Condition remains with usual levels of discomfort.  Stress, without depression or suicidality   Experiencing significant stress due to personal and family health issues, including her husband's medical condition and recent loss of pets. Denies depression or suicidal thoughts. Regular exercise and social support are beneficial. Continue regular exercise and social support.    Recommended follow up: Return in about 1 year (around 08/17/2025) for annual physical-make in July.  Lab/Order associations:n/a fasting  Jenkins CHRISTELLA Carrel, MD

## 2024-08-17 NOTE — Patient Instructions (Signed)

## 2024-09-08 DIAGNOSIS — Z01419 Encounter for gynecological examination (general) (routine) without abnormal findings: Secondary | ICD-10-CM | POA: Diagnosis not present

## 2024-09-08 DIAGNOSIS — Z1272 Encounter for screening for malignant neoplasm of vagina: Secondary | ICD-10-CM | POA: Diagnosis not present

## 2024-09-08 DIAGNOSIS — Z1231 Encounter for screening mammogram for malignant neoplasm of breast: Secondary | ICD-10-CM | POA: Diagnosis not present

## 2024-09-08 DIAGNOSIS — Z6830 Body mass index (BMI) 30.0-30.9, adult: Secondary | ICD-10-CM | POA: Diagnosis not present

## 2024-10-13 ENCOUNTER — Encounter: Payer: Self-pay | Admitting: Family Medicine

## 2024-10-13 ENCOUNTER — Ambulatory Visit: Admitting: Family Medicine

## 2024-10-13 ENCOUNTER — Other Ambulatory Visit: Payer: Self-pay

## 2024-10-13 ENCOUNTER — Other Ambulatory Visit (HOSPITAL_COMMUNITY): Payer: Self-pay

## 2024-10-13 VITALS — BP 122/78 | HR 84 | Temp 98.1°F | Ht 61.0 in | Wt 185.0 lb

## 2024-10-13 DIAGNOSIS — Z23 Encounter for immunization: Secondary | ICD-10-CM | POA: Diagnosis not present

## 2024-10-13 DIAGNOSIS — E063 Autoimmune thyroiditis: Secondary | ICD-10-CM

## 2024-10-13 DIAGNOSIS — E782 Mixed hyperlipidemia: Secondary | ICD-10-CM | POA: Diagnosis not present

## 2024-10-13 DIAGNOSIS — E119 Type 2 diabetes mellitus without complications: Secondary | ICD-10-CM

## 2024-10-13 DIAGNOSIS — E559 Vitamin D deficiency, unspecified: Secondary | ICD-10-CM | POA: Diagnosis not present

## 2024-10-13 DIAGNOSIS — Z7985 Long-term (current) use of injectable non-insulin antidiabetic drugs: Secondary | ICD-10-CM

## 2024-10-13 LAB — CBC WITH DIFFERENTIAL/PLATELET
Basophils Absolute: 0 K/uL (ref 0.0–0.1)
Basophils Relative: 0.7 % (ref 0.0–3.0)
Eosinophils Absolute: 0 K/uL (ref 0.0–0.7)
Eosinophils Relative: 1 % (ref 0.0–5.0)
HCT: 44.9 % (ref 36.0–46.0)
Hemoglobin: 15 g/dL (ref 12.0–15.0)
Lymphocytes Relative: 36.8 % (ref 12.0–46.0)
Lymphs Abs: 1.5 K/uL (ref 0.7–4.0)
MCHC: 33.5 g/dL (ref 30.0–36.0)
MCV: 88.6 fl (ref 78.0–100.0)
Monocytes Absolute: 0.3 K/uL (ref 0.1–1.0)
Monocytes Relative: 6.3 % (ref 3.0–12.0)
Neutro Abs: 2.3 K/uL (ref 1.4–7.7)
Neutrophils Relative %: 55.2 % (ref 43.0–77.0)
Platelets: 277 K/uL (ref 150.0–400.0)
RBC: 5.06 Mil/uL (ref 3.87–5.11)
RDW: 13.6 % (ref 11.5–15.5)
WBC: 4.2 K/uL (ref 4.0–10.5)

## 2024-10-13 LAB — COMPREHENSIVE METABOLIC PANEL WITH GFR
ALT: 26 U/L (ref 0–35)
AST: 22 U/L (ref 0–37)
Albumin: 4.8 g/dL (ref 3.5–5.2)
Alkaline Phosphatase: 65 U/L (ref 39–117)
BUN: 12 mg/dL (ref 6–23)
CO2: 24 meq/L (ref 19–32)
Calcium: 9.6 mg/dL (ref 8.4–10.5)
Chloride: 106 meq/L (ref 96–112)
Creatinine, Ser: 0.85 mg/dL (ref 0.40–1.20)
GFR: 78.43 mL/min (ref >60.00)
Glucose, Bld: 79 mg/dL (ref 70–99)
Potassium: 4.3 meq/L (ref 3.5–5.1)
Sodium: 139 meq/L (ref 135–145)
Total Bilirubin: 0.7 mg/dL (ref 0.2–1.2)
Total Protein: 7.6 g/dL (ref 6.0–8.3)

## 2024-10-13 LAB — LIPID PANEL
Cholesterol: 157 mg/dL (ref 0–200)
HDL: 49.4 mg/dL (ref >39.00)
LDL Cholesterol: 82 mg/dL (ref 0–99)
NonHDL: 107.64
Total CHOL/HDL Ratio: 3
Triglycerides: 127 mg/dL (ref 0.0–149.0)
VLDL: 25.4 mg/dL (ref 0.0–40.0)

## 2024-10-13 LAB — VITAMIN D 25 HYDROXY (VIT D DEFICIENCY, FRACTURES): VITD: 44.4 ng/mL (ref 30.00–100.00)

## 2024-10-13 LAB — MICROALBUMIN / CREATININE URINE RATIO
Creatinine,U: 172.7 mg/dL
Microalb Creat Ratio: 5.4 mg/g (ref 0.0–30.0)
Microalb, Ur: 0.9 mg/dL (ref 0.0–1.9)

## 2024-10-13 LAB — T4, FREE: Free T4: 1.23 ng/dL (ref 0.60–1.60)

## 2024-10-13 LAB — T3, FREE: T3, Free: 3 pg/mL (ref 2.3–4.2)

## 2024-10-13 LAB — TSH: TSH: 1.69 u[IU]/mL (ref 0.35–5.50)

## 2024-10-13 LAB — HEMOGLOBIN A1C: Hgb A1c MFr Bld: 5.2 % (ref 4.6–6.5)

## 2024-10-13 MED ORDER — OMEPRAZOLE 40 MG PO CPDR
40.0000 mg | DELAYED_RELEASE_CAPSULE | Freq: Every day | ORAL | 1 refills | Status: AC
  Filled 2024-10-13: qty 90, 90d supply, fill #0
  Filled 2025-01-11: qty 90, 90d supply, fill #1

## 2024-10-13 MED ORDER — MOUNJARO 15 MG/0.5ML ~~LOC~~ SOAJ
15.0000 mg | SUBCUTANEOUS | 1 refills | Status: AC
  Filled 2024-10-13: qty 2, 28d supply, fill #0
  Filled 2024-11-14: qty 2, 28d supply, fill #1
  Filled 2024-12-08: qty 2, 28d supply, fill #2
  Filled 2025-01-11: qty 2, 28d supply, fill #3

## 2024-10-13 NOTE — Patient Instructions (Addendum)
 It was very nice to see you today!  Add exercises and protein drinks or other   PLEASE NOTE:  If you had any lab tests please let us  know if you have not heard back within a few days. You may see your results on MyChart before we have a chance to review them but we will give you a call once they are reviewed by us . If we ordered any referrals today, please let us  know if you have not heard from their office within the next week.   Please try these tips to maintain a healthy lifestyle:  Eat most of your calories during the day when you are active. Eliminate processed foods including packaged sweets (pies, cakes, cookies), reduce intake of potatoes, white bread, white pasta, and white rice. Look for whole grain options, oat flour or almond flour.  Each meal should contain half fruits/vegetables, one quarter protein, and one quarter carbs (no bigger than a computer mouse).  Cut down on sweet beverages. This includes juice, soda, and sweet tea. Also watch fruit intake, though this is a healthier sweet option, it still contains natural sugar! Limit to 3 servings daily.  Drink at least 1 glass of water with each meal and aim for at least 8 glasses per day  Exercise at least 150 minutes every week.

## 2024-10-13 NOTE — Progress Notes (Signed)
 "  Subjective:     Patient ID: Daisy Ramirez, female    DOB: 1971/02/18, 53 y.o.   MRN: [id number]  Chief Complaint  Patient presents with   Diabetes    47mo follow up; doing alright, no concerns; fasting for labs    Discussed the use of AI scribe software for clinical note transcription with the patient, who gave verbal consent to proceed.  History of Present Illness Daisy Ramirez is a 53 year old female with diabetes, HLD and Hashimoto's thyroiditis who presents with concerns about weight loss and medication management.  She is currently on Mounjaro  for diabetes management and engages in regular exercise, walking a mile and a half every morning. Despite these efforts, she feels 'stuck' with no further weight loss. She would like to lose another 20 to 30 pounds. She mentions walking at a pace that accommodates her walking partner who has kidney issues, and she is unable to increase the pace due to this.  She has a history of menopause, HLD and Hashimoto's thyroiditis, which she feels contribute to her difficulty in losing weight. She is currently taking levothyroxine  150 mcg daily for her thyroid  condition. Her thyroid  levels have been showing underactivity, with numbers 'creeping up instead of down'.  Her current medication regimen includes atorvastatin  20 mg, fish oil, vitamin B12, and vitamin D3. Her vitamin D  levels have historically been low due to limited sun exposure, as she burns easily and uses sunscreen. She has not had her vitamin D  levels checked in over a year.  Her dietary intake includes a breakfast bar, a healthy frozen lunch if she eats lunch, and minimal dinner, often consisting of snacks like nonfat Cheez-Its or a small amount of ice cream.   She recently had a mammogram which returned normal results. She also had an eye exam, but it was not a comprehensive diabetic eye exam due to scheduling issues.    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   Sinusitis 09/05/2015   Synovitis of right knee 11/11/2017   UTI (lower urinary tract infection)     Past Surgical History:  Procedure Laterality Date   ABDOMINAL HYSTERECTOMY  11/16/2012   Procedure: HYSTERECTOMY ABDOMINAL;  Surgeon: Rosaline LITTIE Cobble, MD;  Location: [facility];  Service: Gynecology;  Laterality: N/A;   bilateral knee scope  1997   CHONDROPLASTY Right 11/16/2017   Procedure: CHONDROPLASTY;  Surgeon: Millman Charleston, MD;  Location: [facility];  Service: Orthopedics;  Laterality: Right;   KNEE ARTHROSCOPY WITH LATERAL MENISECTOMY Right  11/16/2017   Procedure: ARTHROSCOPY RIGHT KNEE WITH SYNOVECTOMY, CHONDROPLASTY, PARTIAL LATERAL MENISECTOMY;  Surgeon: Jane Charleston, MD;  Location: [facility];  Service: Orthopedics;  Laterality: Right;   KNEE SURGERY     x 2   KNEE SURGERY     bilateral   SALPINGOOPHORECTOMY  11/16/2012   Procedure: SALPINGO OOPHORECTOMY;  Surgeon: Rosaline LITTIE Cobble, MD;  Location: [facility];  Service: Gynecology;  Laterality: Bilateral;     Current Outpatient Medications:    atorvastatin  (LIPITOR) 20 MG tablet, Take 1 tablet (20  mg total) by mouth daily., Disp: 90 tablet, Rfl: 3   estradiol  (ESTRACE ) 1 MG tablet, Take 1 tablet (1 mg total) by mouth daily., Disp: 90 tablet, Rfl: 3   levothyroxine  (SYNTHROID ) 150 MCG tablet, Take 1 tablet (150 mcg total) by mouth daily before breakfast., Disp: 90 tablet, Rfl: 3   Omega-3 Fatty Acids (FISH OIL PO), Take 1 capsule by mouth daily., Disp: , Rfl:    omeprazole  (PRILOSEC) 40 MG capsule, Take 1 capsule (40 mg total) by mouth daily., Disp: 90 capsule, Rfl: 1   ondansetron  (ZOFRAN ) 4 MG tablet, Take 1 tablet (4 mg total) by mouth every 8 (eight) hours as needed for nausea or vomiting., Disp: 20 tablet, Rfl: 1   temazepam  (RESTORIL ) 15 MG capsule, Take 1 capsule (15 mg total) by mouth at bedtime as needed for sleep., Disp: 30 capsule, Rfl: 1   tirzepatide  (MOUNJARO ) 12.5 MG/0.5ML Pen, Inject 12.5 mg into the skin once a week., Disp: 6 mL, Rfl: 1   vitamin B-12 (CYANOCOBALAMIN ) 500 MCG tablet, Take 500 mcg by mouth daily., Disp: , Rfl:    VITAMIN D  PO, Take 1 capsule by mouth daily., Disp: , Rfl:   No Known Allergies ROS neg/noncontributory except as noted HPI/below      Objective:     BP 122/78   Pulse 84   Temp 98.1 F (36.7 C)   Ht 5' 1 (1.549 m)   Wt 185 lb (83.9 kg)   LMP 10/18/2012   SpO2 99%   BMI 34.96 kg/m  Wt Readings from Last 3 Encounters:  10/13/24 185 lb (83.9 kg)  08/17/24 183 lb 3.2 oz (83.1 kg)  04/11/24 187 lb (84.8 kg)    Physical Exam GENERAL: Well developed well nourished no acute distress HEAD EYES EARS NOSE THROAT: Normocephalic atraumatic, conjunctiva not injected, sclera nonicteric CARDIAC: Regular rate and rhythm, S1 S2 present , no murmur, dorsalis pedis 2 plus bilaterally NECK: Supple, no thyromegaly, no nodes, no carotid bruits LUNGS: Clear to auscultation bilaterally, no wheezes ABDOMEN: Bowel sounds present, soft, non tender non distended, no hepatosplenomegaly, no masses EXTREMITIES: No edema MUSCULOSKELETAL: No gross  abnormalities NEUROLOGICAL: Alert and oriented x3, cranial nerves II through XII intact PSYCHIATRIC: Normal mood, good eye contact       Assessment & Plan:  HASHIMOTO'S THYROIDITIS  Type 2 diabetes mellitus without complication, without long-term current use of insulin (HCC)  Mixed hyperlipidemia    Assessment and Plan Assessment & Plan Type 2 diabetes mellitus   Type 2 diabetes mellitus is managed with Mounjaro , but the current dose is not resulting in further weight loss. Increase Mounjaro  to 15mg  weekly.  Try to incorporate other exercises like push ups, squats, etc.  Obesity   Obesity persists despite regular exercise and dietary measures, with a weight loss plateau possibly influenced by menopause and insufficient caloric intake. Increase Mounjaro  dosage. Incorporate varied exercises such as squats, lunges, and stair climbing. Ensure a daily  caloric intake of approximately 1200 calories with adequate protein. Consider keeping a food diary to identify any dietary issues.  Autoimmune thyroiditis (Hashimoto's disease)   Autoimmune thyroiditis is managed with levothyroxine  150 mcg daily. Recent labs indicate an underactive thyroid  with increasing TSH levels, suggesting a need for dosage adjustment. Check thyroid  function tests to assess current levels and consider adjusting levothyroxine  dosage based on test results.  Mixed hyperlipidemia   Mixed hyperlipidemia is managed with atorvastatin  20 mg daily.  Osteoarthritis of knees   Osteoarthritis of the knees causes difficulty with certain exercises. Incorporate exercises that are less stressful on the knees, such as squats and lunges, if tolerable.  Menopausal state   Menopausal state is managed with estradiol , and current management is effective.  General Health Maintenance   General health maintenance was discussed in the context of a recent mammogram and eye exam. Mammogram results were normal. The eye exam was basic and did not  include diabetic retinopathy screening. Schedule a diabetic eye exam with an optometrist.     Return in about 6 months (around 04/13/2025) for chronic follow-up.  Jenkins CHRISTELLA Carrel, MD  "

## 2024-10-14 ENCOUNTER — Other Ambulatory Visit: Payer: Self-pay | Admitting: Family Medicine

## 2024-10-14 DIAGNOSIS — E782 Mixed hyperlipidemia: Secondary | ICD-10-CM

## 2024-10-15 ENCOUNTER — Ambulatory Visit: Payer: Self-pay | Admitting: Family Medicine

## 2024-10-15 ENCOUNTER — Other Ambulatory Visit (HOSPITAL_COMMUNITY): Payer: Self-pay

## 2024-10-15 MED ORDER — LEVOTHYROXINE SODIUM 150 MCG PO TABS
150.0000 ug | ORAL_TABLET | Freq: Every day | ORAL | 3 refills | Status: AC
  Filled 2024-10-15: qty 90, 90d supply, fill #0
  Filled 2025-01-11: qty 90, 90d supply, fill #1

## 2024-10-15 MED ORDER — ATORVASTATIN CALCIUM 20 MG PO TABS
20.0000 mg | ORAL_TABLET | Freq: Every day | ORAL | 3 refills | Status: AC
  Filled 2024-10-15: qty 90, 90d supply, fill #0
  Filled 2025-01-11: qty 90, 90d supply, fill #1

## 2024-10-15 NOTE — Progress Notes (Signed)
Labs awesome

## 2024-11-14 ENCOUNTER — Other Ambulatory Visit: Payer: Self-pay

## 2024-12-08 ENCOUNTER — Other Ambulatory Visit: Payer: Self-pay

## 2024-12-25 ENCOUNTER — Other Ambulatory Visit (HOSPITAL_COMMUNITY): Payer: Self-pay

## 2025-01-11 ENCOUNTER — Other Ambulatory Visit: Payer: Self-pay

## 2025-04-13 ENCOUNTER — Ambulatory Visit: Admitting: Family Medicine

## 2025-08-21 ENCOUNTER — Encounter: Admitting: Family Medicine
